# Patient Record
Sex: Female | Born: 1975 | Race: Black or African American | Hispanic: No | Marital: Married | Smoking: Current some day smoker
Health system: Southern US, Community
[De-identification: ages and names within clinical notes are randomized; demographics above are authoritative.]

## PROBLEM LIST (undated history)

## (undated) DIAGNOSIS — D649 Anemia, unspecified: Secondary | ICD-10-CM

## (undated) DIAGNOSIS — E7800 Pure hypercholesterolemia, unspecified: Secondary | ICD-10-CM

## (undated) DIAGNOSIS — I10 Essential (primary) hypertension: Secondary | ICD-10-CM

---

## 2018-07-27 DIAGNOSIS — G5712 Meralgia paresthetica, left lower limb: Secondary | ICD-10-CM | POA: Insufficient documentation

## 2018-07-27 DIAGNOSIS — M5417 Radiculopathy, lumbosacral region: Secondary | ICD-10-CM | POA: Insufficient documentation

## 2020-04-03 DIAGNOSIS — F17210 Nicotine dependence, cigarettes, uncomplicated: Secondary | ICD-10-CM | POA: Insufficient documentation

## 2020-04-03 DIAGNOSIS — G4733 Obstructive sleep apnea (adult) (pediatric): Secondary | ICD-10-CM | POA: Insufficient documentation

## 2020-11-27 ENCOUNTER — Encounter (HOSPITAL_COMMUNITY): Payer: Self-pay

## 2020-11-27 ENCOUNTER — Emergency Department (HOSPITAL_COMMUNITY)
Admission: EM | Admit: 2020-11-27 | Discharge: 2020-11-27 | Disposition: A | Discharge status: Home / Self Care | Payer: Medicaid Other | Attending: Emergency Medicine | Admitting: Emergency Medicine

## 2020-11-27 ENCOUNTER — Other Ambulatory Visit: Payer: Self-pay

## 2020-11-27 DIAGNOSIS — I10 Essential (primary) hypertension: Secondary | ICD-10-CM | POA: Insufficient documentation

## 2020-11-27 DIAGNOSIS — Z20822 Contact with and (suspected) exposure to covid-19: Secondary | ICD-10-CM | POA: Diagnosis not present

## 2020-11-27 DIAGNOSIS — H6692 Otitis media, unspecified, left ear: Secondary | ICD-10-CM | POA: Diagnosis not present

## 2020-11-27 DIAGNOSIS — R058 Other specified cough: Secondary | ICD-10-CM

## 2020-11-27 DIAGNOSIS — H6690 Otitis media, unspecified, unspecified ear: Secondary | ICD-10-CM

## 2020-11-27 DIAGNOSIS — J069 Acute upper respiratory infection, unspecified: Secondary | ICD-10-CM | POA: Insufficient documentation

## 2020-11-27 DIAGNOSIS — R059 Cough, unspecified: Secondary | ICD-10-CM | POA: Diagnosis present

## 2020-11-27 HISTORY — DX: Essential (primary) hypertension: I10

## 2020-11-27 LAB — RESP PANEL BY RT-PCR (FLU A&B, COVID) ARPGX2
Influenza A by PCR: NEGATIVE
Influenza B by PCR: NEGATIVE
SARS Coronavirus 2 by RT PCR: NEGATIVE

## 2020-11-27 MED ORDER — BENZONATATE 100 MG PO CAPS: 100.0000 mg | ORAL_CAPSULE | Freq: Three times a day (TID) | ORAL | 0 refills | Status: DC

## 2020-11-27 MED ORDER — AMOXICILLIN-POT CLAVULANATE 875-125 MG PO TABS: 1.0000 | ORAL_TABLET | Freq: Two times a day (BID) | ORAL | 0 refills | Status: AC

## 2020-11-27 NOTE — Discharge Instructions (Addendum)
Recommend follow-up in 3 days for further imaging and reassessment if symptoms do not resolve

## 2020-11-27 NOTE — ED Provider Notes (Signed)
[facility]-EMERGENCY DEPT Provider Note   CSN: [id number] Arrival date & time: 11/27/20  0805     History Chief Complaint  Patient presents with   Cough   Nasal Congestion    Miranda Charles is a 45 y.o. female.   Cough Cough characteristics:  Productive Sputum characteristics:  Rusty Severity:  Mild Duration:  2 days Timing:  Constant Progression:  Worsening Chronicity:  New Smoker: yes   Context: upper respiratory infection   Relieved by:  Nothing Worsened by:  Nothing Ineffective treatments:  None tried Associated symptoms: chills, ear fullness, ear pain, rhinorrhea, sinus congestion and sore throat   Associated symptoms: no fever, no shortness of breath and no wheezing    45 year old female presenting to the emergency department with a cough, chills and nasal congestion for the past 2 days.  She also endorses left-sided ear pain and a sensation of fullness in her left ear.  She endorses nasal drainage and posterior oropharyngeal drainage.  She states that her cough is mildly productive of rusty sputum she denies any fevers.  She denies any specific shortness of breath or chest pain.  She states that her chest only hurts when she coughs.  She denies a history of a diagnosis of COPD  Past Medical History:  Diagnosis Date   HTN (hypertension)     There are no problems to display for this patient.   History reviewed. No pertinent surgical history.   OB History   No obstetric history on file.     History reviewed. No pertinent family history.  Social History   Tobacco Use   Smoking status: Never   Smokeless tobacco: Never    Home Medications Prior to Admission medications   Medication Sig Start Date End Date Taking? Authorizing Provider  amoxicillin-clavulanate (AUGMENTIN) 875-125 MG tablet Take 1 tablet by mouth every 12 (twelve) hours for 5 days. 11/27/20 12/02/20 Yes Ernie Avena, MD  benzonatate (TESSALON) 100 MG capsule Take 1  capsule (100 mg total) by mouth every 8 (eight) hours. 11/27/20  Yes Ernie Avena, MD    Allergies    Onion  Review of Systems   Review of Systems  Constitutional:  Positive for chills. Negative for fever.  HENT:  Positive for ear pain, rhinorrhea and sore throat.   Respiratory:  Positive for cough. Negative for shortness of breath and wheezing.   All other systems reviewed and are negative.  Physical Exam Updated Vital Signs BP (!) 151/89 (BP Location: Left Arm)   Pulse 82   Temp 97.9 F (36.6 C) (Oral)   Resp 18   LMP 11/25/2020 (Approximate)   SpO2 98%   Physical Exam Vitals and nursing note reviewed.  Constitutional:      General: She is not in acute distress.    Appearance: She is well-developed.  HENT:     Head: Normocephalic and atraumatic.     Right Ear: Tympanic membrane, ear canal and external ear normal.     Left Ear: A middle ear effusion is present. No mastoid tenderness. Tympanic membrane is bulging. Tympanic membrane is not perforated.  Eyes:     Conjunctiva/sclera: Conjunctivae normal.  Cardiovascular:     Rate and Rhythm: Normal rate and regular rhythm.     Heart sounds: No murmur heard. Pulmonary:     Effort: Pulmonary effort is normal. No respiratory distress.     Breath sounds: Normal breath sounds.  Abdominal:     Palpations: Abdomen is soft.  Tenderness: There is no abdominal tenderness.  Musculoskeletal:     Cervical back: Neck supple.  Skin:    General: Skin is warm and dry.  Neurological:     Mental Status: She is alert.    ED Results / Procedures / Treatments   Labs (all labs ordered are listed, but only abnormal results are displayed) Labs Reviewed  RESP PANEL BY RT-PCR (FLU A&B, COVID) [id number]    EKG None  Radiology No results found.  Procedures Procedures   Medications Ordered in ED Medications - No data to display  ED Course  I have reviewed the triage vital signs and the nursing notes.  Pertinent labs & imaging  results that were available during my care of the patient were reviewed by me and considered in my medical decision making (see chart for details).    MDM Rules/Calculators/A&P                           Miranda Charles is a 45 y.o. female who presents to the ED with a 2 day history of ear pain, productive cough, rhinorrhea, and nasal congestion. No fevers.  On my exam, the patient is well-appearing and well-hydrated.  The patient's lungs are clear to auscultation bilaterally. Additionally, the patient has a soft/non-tender abdomen, and no oropharyngeal exudates.  There are no signs of meningismus.  Concerns for otitis media on the left with a bulging tympanic membrane.  No mastoid tenderness to suggest mastoiditis.  Patient is overall well-appearing, lungs are clear to auscultation bilaterally with symptoms consistent with likely URI.  The patient's presentation is most consistent with a viral upper respiratory infection with some concern for otitis media.  Given the patient's respiratory symptoms, productive cough, bulging TM on the left, will treat with a course of Augmentin and have the patient follow-up outpatient.  Lungs are clear to auscultation, I do not think further evaluation with chest x-ray given the short duration of symptoms is warranted at this time.  If patient remains unimproved after a course of outpatient antibiotics, recommended the patient return to the emergency department for repeat evaluation and further imaging.  COVID-19 and influenza PCR testing collected and resulted negative.  I discussed symptomatic management, including hydration, motrin, and tylenol. The patient felt safe being discharged from the ED.  They agreed to followup with the PCP if needed.  I provided ED return precautions.  Final Clinical Impression(s) / ED Diagnoses Final diagnoses:  Productive cough  Acute otitis media, unspecified otitis media type  Upper respiratory tract infection, unspecified type     Rx / DC Orders ED Discharge Orders          Ordered    amoxicillin-clavulanate (AUGMENTIN) 875-125 MG tablet  Every 12 hours        11/27/20 1002    benzonatate (TESSALON) 100 MG capsule  Every 8 hours        11/27/20 1002             Ernie Avena, MD 11/27/20 1004

## 2020-11-27 NOTE — ED Triage Notes (Signed)
Pt arrived via POV, c/o cough, chills and nasal congestion since last night. Denies any known sick contacts.

## 2020-12-24 ENCOUNTER — Other Ambulatory Visit: Payer: Self-pay

## 2020-12-24 ENCOUNTER — Ambulatory Visit
Admission: RE | Admit: 2020-12-24 | Discharge: 2020-12-24 | Disposition: A | Discharge status: Home / Self Care | Payer: Medicaid Other | Source: Ambulatory Visit | Attending: Physician Assistant | Admitting: Physician Assistant

## 2020-12-24 VITALS — BP 128/86 | HR 61 | Temp 97.9°F | Resp 16

## 2020-12-24 DIAGNOSIS — M25511 Pain in right shoulder: Secondary | ICD-10-CM | POA: Diagnosis not present

## 2020-12-24 MED ORDER — PREDNISONE 20 MG PO TABS: 40.0000 mg | ORAL_TABLET | Freq: Every day | ORAL | 0 refills | Status: AC

## 2020-12-24 MED ORDER — CYCLOBENZAPRINE HCL 10 MG PO TABS: 10.0000 mg | ORAL_TABLET | Freq: Two times a day (BID) | ORAL | 0 refills | Status: DC | PRN

## 2020-12-24 NOTE — ED Triage Notes (Signed)
Sharp right shoulder pain worsening with movement or pressure that occasionally "shoots up into my neck." Also states that when she's working it will occasionally give out. Denies numbness/tingling, SOB, chest pain. 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

## 2020-12-24 NOTE — Discharge Instructions (Addendum)
Follow up with ortho if symptoms fail to improve or worsen in any way.  

## 2020-12-24 NOTE — ED Provider Notes (Signed)
[facility]    CSN: [id number] Arrival date & time: 12/24/20  1156      History   Chief Complaint Chief Complaint  Patient presents with   Shoulder Pain    HPI Miranda Charles is a 45 y.o. female.   Patient here today for evaluation of right shoulder pain that is been ongoing for the last couple months.  She states that she will have worsening pain at times, and states that most recently any movement of her shoulder causes pain specifically lifting and moving the 10 pound back she most frequently at work.  She has not had any numbness, but does state that the pain sometimes will radiate into her right side of neck.  She has not any chest pain shortness of breath tingling.     Shoulder Pain Associated symptoms: no fever    Past Medical History:  Diagnosis Date   HTN (hypertension)     There are no problems to display for this patient.   History reviewed. No pertinent surgical history.  OB History   No obstetric history on file.      Home Medications    Prior to Admission medications   Medication Sig Start Date End Date Taking? Authorizing Provider  cyclobenzaprine (FLEXERIL) 10 MG tablet Take 1 tablet (10 mg total) by mouth 2 (two) times daily as needed for muscle spasms. 12/24/20  Yes Tomi Bamberger, PA-C  predniSONE (DELTASONE) 20 MG tablet Take 2 tablets (40 mg total) by mouth daily with breakfast for 5 days. 12/24/20 12/29/20 Yes Tomi Bamberger, PA-C  benzonatate (TESSALON) 100 MG capsule Take 1 capsule (100 mg total) by mouth every 8 (eight) hours. 11/27/20   Ernie Avena, MD    Family History History reviewed. No pertinent family history.  Social History Social History   Tobacco Use   Smoking status: Never   Smokeless tobacco: Never     Allergies   Onion   Review of Systems Review of Systems  Constitutional:  Negative for chills and fever.  Eyes:  Negative for discharge and redness.  Respiratory:  Negative for shortness of  breath.   Cardiovascular:  Negative for chest pain.  Gastrointestinal:  Negative for nausea and vomiting.  Musculoskeletal:  Positive for arthralgias and myalgias.  Neurological:  Negative for numbness.    Physical Exam Triage Vital Signs ED Triage Vitals  Enc Vitals Group     BP 12/24/20 1211 128/86     Pulse Rate 12/24/20 1211 61     Resp 12/24/20 1211 16     Temp 12/24/20 1211 97.9 F (36.6 C)     Temp Source 12/24/20 1211 Oral     SpO2 12/24/20 1211 98 %     Weight --      Height --      Head Circumference --      Peak Flow --      Pain Score 12/24/20 1212 9     Pain Loc --      Pain Edu? --      Excl. in GC? --    No data found.  Updated Vital Signs BP 128/86 (BP Location: Left Arm)   Pulse 61   Temp 97.9 F (36.6 C) (Oral)   Resp 16   LMP 11/25/2020 (Approximate)   SpO2 98%      Physical Exam Vitals and nursing note reviewed.  Constitutional:      General: She is not in acute distress.    Appearance: Normal  appearance. She is not ill-appearing.  HENT:     Head: Normocephalic and atraumatic.  Eyes:     Conjunctiva/sclera: Conjunctivae normal.  Cardiovascular:     Rate and Rhythm: Normal rate.  Pulmonary:     Effort: Pulmonary effort is normal.  Musculoskeletal:     Comments: Decreased range of motion of shoulder in all directions due to pain.  Mild tenderness to palpation to diffuse posterior R shoulder  Skin:    General: Skin is warm and dry.  Neurological:     Mental Status: She is alert.  Psychiatric:        Mood and Affect: Mood normal.        Behavior: Behavior normal.        Thought Content: Thought content normal.     UC Treatments / Results  Labs (all labs ordered are listed, but only abnormal results are displayed) Labs Reviewed - No data to display  EKG   Radiology No results found.  Procedures Procedures (including critical care time)  Medications Ordered in UC Medications - No data to display  Initial Impression /  Assessment and Plan / UC Course  I have reviewed the triage vital signs and the nursing notes.  Pertinent labs & imaging results that were available during my care of the patient were reviewed by me and considered in my medical decision making (see chart for details).  Unknown cause of shoulder pain, but given chronicity recommended further evaluation by Ortho.  Contact information for same.  We will trial steroids and muscle relaxer in the meantime.  Encouraged follow-up with any further concerns.  Final Clinical Impressions(s) / UC Diagnoses   Final diagnoses:  Acute pain of right shoulder     Discharge Instructions      Follow up with ortho if symptoms fail to improve or worsen in any way.      ED Prescriptions     Medication Sig Dispense Auth. Provider   predniSONE (DELTASONE) 20 MG tablet Take 2 tablets (40 mg total) by mouth daily with breakfast for 5 days. 10 tablet Erma Pinto F, PA-C   cyclobenzaprine (FLEXERIL) 10 MG tablet Take 1 tablet (10 mg total) by mouth 2 (two) times daily as needed for muscle spasms. 20 tablet Tomi Bamberger, PA-C      PDMP not reviewed this encounter.   Tomi Bamberger, PA-C 12/24/20 1301

## 2020-12-28 ENCOUNTER — Ambulatory Visit: Payer: Medicaid Other | Admitting: Orthopaedic Surgery

## 2021-01-04 ENCOUNTER — Encounter: Payer: Self-pay | Admitting: Orthopaedic Surgery

## 2021-01-04 ENCOUNTER — Ambulatory Visit: Payer: Self-pay

## 2021-01-04 ENCOUNTER — Ambulatory Visit (INDEPENDENT_AMBULATORY_CARE_PROVIDER_SITE_OTHER): Payer: Medicaid Other | Admitting: Orthopaedic Surgery

## 2021-01-04 DIAGNOSIS — M25511 Pain in right shoulder: Secondary | ICD-10-CM | POA: Diagnosis not present

## 2021-01-04 DIAGNOSIS — G8929 Other chronic pain: Secondary | ICD-10-CM | POA: Diagnosis not present

## 2021-01-04 MED ORDER — LIDOCAINE HCL 2 % IJ SOLN
2.0000 mL | INTRAMUSCULAR | Status: AC | PRN
  Administered 2021-01-04: 2 mL

## 2021-01-04 MED ORDER — BUPIVACAINE HCL 0.25 % IJ SOLN
2.0000 mL | INTRAMUSCULAR | Status: AC | PRN
  Administered 2021-01-04: 2 mL via INTRA_ARTICULAR

## 2021-01-04 MED ORDER — TRAMADOL HCL 50 MG PO TABS: 50.0000 mg | ORAL_TABLET | Freq: Three times a day (TID) | ORAL | 2 refills | Status: DC | PRN

## 2021-01-04 MED ORDER — METHYLPREDNISOLONE ACETATE 40 MG/ML IJ SUSP
40.0000 mg | INTRAMUSCULAR | Status: AC | PRN
  Administered 2021-01-04: 40 mg via INTRA_ARTICULAR

## 2021-01-04 NOTE — Progress Notes (Signed)
Office Visit Note   Patient: Miranda Charles           Date of Birth: Mar 29, 1975           MRN: [id number] Visit Date: 01/04/2021              Requested by: No referring provider defined for this encounter. PCP: Pcp, No   Assessment & Plan: Visit Diagnoses:  1. Chronic right shoulder pain     Plan: Impression is right shoulder subacromial bursitis and rotator cuff tendinitis.  Today, we discussed proceeding with subacromial cortisone injection as well as a Jobe exercise program.  She is in agreement with this plan.  Should her symptoms not improve over the next several weeks she will call us and let us know we will obtain an MRI to assess for structural abnormalities.  Otherwise, call with concerns or questions in the meantime.  Follow-Up Instructions: Return if symptoms worsen or fail to improve.   Orders:  Orders Placed This Encounter  Procedures   Large Joint Inj: R subacromial bursa   XR Shoulder Right   No orders of the defined types were placed in this encounter.     Procedures: Large Joint Inj: R subacromial bursa on 01/04/2021 10:56 AM Indications: pain Details: 22 G needle Medications: 2 mL lidocaine 2 %; 2 mL bupivacaine 0.25 %; 40 mg methylPREDNISolone acetate 40 MG/ML Outcome: tolerated well, no immediate complications Patient was prepped and draped in the usual sterile fashion.      Clinical Data: No additional findings.   Subjective: Chief Complaint  Patient presents with   Right Shoulder - Pain    HPI patient is a very pleasant 45 year old female who comes in today with right shoulder pain for the past 2 months.  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  The pain she has been having is primarily to the proximal deltoid and radiates to the top of her shoulder into the lateral right neck.  Pain is worse with abduction and forward flexion.   She was seen at [facility] urgent care recently where she was started on a steroid taper and Flexeril.  This is mildly helped her symptoms.  She denies any paresthesias to the right upper extremity.  No previous cortisone injection to right shoulder.  Review of Systems as detailed in HPI.  All others reviewed and are negative.   Objective: Vital Signs: There were no vitals taken for this visit.  Physical Exam well-developed well-nourished female in no acute distress.  Alert and oriented x3.  Ortho Exam right shoulder exam shows forward flexion to about 100 degrees.  Abduction to about 90 degrees.  Full external rotation.  Internal rotation to L5.  Markedly positive empty can test with 4 out of 5 strength.  Otherwise, 5 out of 5 strength throughout.  She is neurovascularly intact distally.  Specialty Comments:  No specialty comments available.  Imaging: No results found.   PMFS History: There are no problems to display for this patient.  Past Medical History:  Diagnosis Date   HTN (hypertension)     No family history on file.  History reviewed. No pertinent surgical history. Social History   Occupational History   Not on file  Tobacco Use   Smoking status: Never   Smokeless tobacco: Never  Substance and Sexual Activity   Alcohol use: Not  on file   Drug use: Not on file   Sexual activity: Not on file

## 2021-01-29 ENCOUNTER — Ambulatory Visit: Payer: Medicaid Other | Admitting: Orthopaedic Surgery

## 2021-03-08 ENCOUNTER — Other Ambulatory Visit: Payer: Self-pay

## 2021-03-08 ENCOUNTER — Ambulatory Visit
Admission: EM | Admit: 2021-03-08 | Discharge: 2021-03-08 | Disposition: A | Discharge status: Home / Self Care | Payer: Medicaid Other

## 2021-03-08 ENCOUNTER — Ambulatory Visit (INDEPENDENT_AMBULATORY_CARE_PROVIDER_SITE_OTHER): Payer: Medicaid Other

## 2021-03-08 DIAGNOSIS — M545 Low back pain, unspecified: Secondary | ICD-10-CM

## 2021-03-08 DIAGNOSIS — W19XXXA Unspecified fall, initial encounter: Secondary | ICD-10-CM

## 2021-03-08 DIAGNOSIS — M546 Pain in thoracic spine: Secondary | ICD-10-CM

## 2021-03-08 DIAGNOSIS — M25511 Pain in right shoulder: Secondary | ICD-10-CM

## 2021-03-08 DIAGNOSIS — S3992XA Unspecified injury of lower back, initial encounter: Secondary | ICD-10-CM

## 2021-03-08 HISTORY — DX: Anemia, unspecified: D64.9

## 2021-03-08 HISTORY — DX: Pure hypercholesterolemia, unspecified: E78.00

## 2021-03-08 MED ORDER — PREDNISONE 20 MG PO TABS: 40.0000 mg | ORAL_TABLET | Freq: Every day | ORAL | 0 refills | Status: AC

## 2021-03-08 MED ORDER — CYCLOBENZAPRINE HCL 10 MG PO TABS: 10.0000 mg | ORAL_TABLET | Freq: Two times a day (BID) | ORAL | 0 refills | Status: DC | PRN

## 2021-03-08 NOTE — ED Provider Notes (Signed)
[facility]    CSN: [id number] Arrival date & time: 03/08/21  1138      History   Chief Complaint Chief Complaint  Patient presents with   Fall   Back Pain    HPI Miranda Charles is a 45 y.o. female.   Patient here today for evaluation of back pain that is been ongoing since she fell on Monday of this week.  She states that her foot slipped and when she did she fell flat on her back.  She did not hit her head and had no loss of consciousness.  She states since the fall she has continued to have pain from her thoracic spine to her lumbar spine.  She has not had any numbness or tingling.  She does report some pain to her right shoulder as well that is worsened with movement.  She notes bruising to her posterior right shoulder.  She has been taking Aleve without significant relief.   The history is provided by the patient.  Fall Pertinent negatives include no abdominal pain and no shortness of breath.  Back Pain Associated symptoms: no abdominal pain, no fever and no numbness    Past Medical History:  Diagnosis Date   Anemia    High cholesterol    HTN (hypertension)     There are no problems to display for this patient.   History reviewed. No pertinent surgical history.  OB History   No obstetric history on file.      Home Medications    Prior to Admission medications   Medication Sig Start Date End Date Taking? Authorizing Provider  amLODipine (NORVASC) 5 MG tablet Take 1 tablet by mouth daily. 04/07/18  Yes [provider]  atorvastatin (LIPITOR) 20 MG tablet Take 1 tablet by mouth daily. 07/19/20  Yes [provider]  cetirizine (ZYRTEC) 10 MG tablet Take by mouth. 08/19/19  Yes [provider]  cyclobenzaprine (FLEXERIL) 10 MG tablet Take 1 tablet (10 mg total) by mouth 2 (two) times daily as needed for muscle spasms. 03/08/21  Yes Tomi Bamberger, PA-C  metoprolol tartrate (LOPRESSOR) 25 MG tablet Take by mouth. 11/07/19  Yes  [provider]  predniSONE (DELTASONE) 20 MG tablet Take 2 tablets (40 mg total) by mouth daily with breakfast for 5 days. 03/08/21 03/13/21 Yes Tomi Bamberger, PA-C  benzonatate (TESSALON) 100 MG capsule Take 1 capsule (100 mg total) by mouth every 8 (eight) hours. 11/27/20   Ernie Avena, MD  Cholecalciferol 125 MCG (5000 UT) TABS Take by mouth.    [provider]  ferrous sulfate 325 (65 FE) MG tablet Take by mouth.    [provider]  traMADol (ULTRAM) 50 MG tablet Take 1 tablet (50 mg total) by mouth 3 (three) times daily as needed. 01/04/21   Cristie Hem, PA-C    Family History History reviewed. No pertinent family history.  Social History Social History   Tobacco Use   Smoking status: Some Days    Types: Cigarettes   Smokeless tobacco: Never  Vaping Use   Vaping Use: Never used  Substance Use Topics   Alcohol use: Yes    Comment: social drinker   Drug use: Never     Allergies   Onion   Review of Systems Review of Systems  Constitutional:  Negative for chills and fever.  Eyes:  Negative for discharge and redness.  Respiratory:  Negative for shortness of breath.   Gastrointestinal:  Negative for abdominal pain,  nausea and vomiting.  Genitourinary:  Positive for vaginal bleeding and vaginal discharge.  Musculoskeletal:  Positive for arthralgias, back pain and myalgias.  Neurological:  Negative for numbness.    Physical Exam Triage Vital Signs ED Triage Vitals  Enc Vitals Group     BP 03/08/21 1257 122/83     Pulse Rate 03/08/21 1257 62     Resp 03/08/21 1257 18     Temp 03/08/21 1257 98.5 F (36.9 C)     Temp src --      SpO2 03/08/21 1257 98 %     Weight --      Height --      Head Circumference --      Peak Flow --      Pain Score 03/08/21 1254 8     Pain Loc --      Pain Edu? --      Excl. in GC? --    No data found.  Updated Vital Signs BP 122/83    Pulse 62    Temp 98.5 F (36.9 C)    Resp 18    LMP  02/18/2021 (Approximate)    SpO2 98%      Physical Exam Vitals and nursing note reviewed.  Constitutional:      General: She is not in acute distress.    Appearance: Normal appearance. She is not ill-appearing.  HENT:     Head: Normocephalic and atraumatic.  Eyes:     Conjunctiva/sclera: Conjunctivae normal.  Cardiovascular:     Rate and Rhythm: Normal rate.  Pulmonary:     Effort: Pulmonary effort is normal.  Musculoskeletal:     Comments: Mild TTP diffusely to midline spine from thoracic through lumbar spine, mild TTP to right posterior shoulder. Full ROM of right shoulder, elbow, wrist  Skin:    Comments: Mild bruising to right posterior shoulder  Neurological:     Mental Status: She is alert.  Psychiatric:        Mood and Affect: Mood normal.        Behavior: Behavior normal.        Thought Content: Thought content normal.     UC Treatments / Results  Labs (all labs ordered are listed, but only abnormal results are displayed) Labs Reviewed - No data to display  EKG   Radiology DG Thoracic Spine 2 View  Result Date: 03/08/2021 CLINICAL DATA:  Fall, pain, initial encounter. EXAM: THORACIC SPINE 2 VIEWS COMPARISON:  None. FINDINGS: Alignment is anatomic. Vertebral body and disc space height are maintained. Very mild endplate degenerative changes in the midthoracic spine. Visualized chest is unremarkable. IMPRESSION: No acute findings. Electronically Signed   By: Leanna Battles M.D.   On: 03/08/2021 13:48   DG Lumbar Spine Complete  Result Date: 03/08/2021 CLINICAL DATA:  Fall, low back pain, initial encounter. EXAM: LUMBAR SPINE - COMPLETE 4+ VIEW COMPARISON:  None. FINDINGS: Alignment is anatomic. Vertebral body and disc space height are maintained. No degenerative changes. No definite pars defects. IMPRESSION: No acute findings. Electronically Signed   By: Leanna Battles M.D.   On: 03/08/2021 13:46   DG Shoulder Right  Result Date: 03/08/2021 CLINICAL DATA:   Fall, pain, initial encounter. EXAM: RIGHT SHOULDER - 2+ VIEW COMPARISON:  01/04/2021. FINDINGS: No acute osseous or joint abnormality. Visualized portion of the right chest is unremarkable. IMPRESSION: Negative. Electronically Signed   By: Leanna Battles M.D.   On: 03/08/2021 13:47    Procedures Procedures (including critical care  time)  Medications Ordered in UC Medications - No data to display  Initial Impression / Assessment and Plan / UC Course  I have reviewed the triage vital signs and the nursing notes.  Pertinent labs & imaging results that were available during my care of the patient were reviewed by me and considered in my medical decision making (see chart for details).    Prednisone and Flexeril prescribed for suspected muscular pain.  No fractures noted on x-ray.  Recommended follow-up if symptoms do not improve with treatment or if they worsen in any way.  Final Clinical Impressions(s) / UC Diagnoses   Final diagnoses:  Fall, initial encounter  Injury of back, initial encounter   Discharge Instructions   None    ED Prescriptions     Medication Sig Dispense Auth. Provider   predniSONE (DELTASONE) 20 MG tablet Take 2 tablets (40 mg total) by mouth daily with breakfast for 5 days. 10 tablet Erma Pinto F, PA-C   cyclobenzaprine (FLEXERIL) 10 MG tablet Take 1 tablet (10 mg total) by mouth 2 (two) times daily as needed for muscle spasms. 20 tablet Tomi Bamberger, PA-C      PDMP not reviewed this encounter.   Tomi Bamberger, PA-C 03/08/21 1447

## 2021-03-08 NOTE — ED Triage Notes (Signed)
Patient presents to Urgent Care with complaints of back pain from a fall on Monday. She states she slipped getting out her car. She is not sure if she hit her head. Treating pain with aleve.   Denies LOC.

## 2021-04-03 ENCOUNTER — Ambulatory Visit (INDEPENDENT_AMBULATORY_CARE_PROVIDER_SITE_OTHER): Payer: Medicaid Other | Admitting: Nurse Practitioner

## 2021-04-03 ENCOUNTER — Other Ambulatory Visit: Payer: Self-pay

## 2021-04-03 ENCOUNTER — Encounter: Payer: Self-pay | Admitting: Nurse Practitioner

## 2021-04-03 VITALS — BP 131/85 | HR 89 | Temp 98.1°F | Ht 61.0 in | Wt 258.2 lb

## 2021-04-03 DIAGNOSIS — R300 Dysuria: Secondary | ICD-10-CM

## 2021-04-03 DIAGNOSIS — Z0000 Encounter for general adult medical examination without abnormal findings: Secondary | ICD-10-CM | POA: Diagnosis not present

## 2021-04-03 DIAGNOSIS — Z6841 Body Mass Index (BMI) 40.0 and over, adult: Secondary | ICD-10-CM

## 2021-04-03 DIAGNOSIS — M545 Low back pain, unspecified: Secondary | ICD-10-CM | POA: Diagnosis not present

## 2021-04-03 DIAGNOSIS — F17210 Nicotine dependence, cigarettes, uncomplicated: Secondary | ICD-10-CM

## 2021-04-03 LAB — POCT GLYCOSYLATED HEMOGLOBIN (HGB A1C)
HbA1c POC (<> result, manual entry): 5.4 % (ref 4.0–5.6)
HbA1c, POC (controlled diabetic range): 5.4 % (ref 0.0–7.0)
HbA1c, POC (prediabetic range): 5.4 % — AB (ref 5.7–6.4)
Hemoglobin A1C: 5.4 % (ref 4.0–5.6)

## 2021-04-03 LAB — POCT URINALYSIS DIP (CLINITEK)
Bilirubin, UA: NEGATIVE
Blood, UA: NEGATIVE
Glucose, UA: NEGATIVE mg/dL
Ketones, POC UA: NEGATIVE mg/dL
Leukocytes, UA: NEGATIVE
Nitrite, UA: NEGATIVE
POC PROTEIN,UA: NEGATIVE
Spec Grav, UA: 1.025 (ref 1.010–1.025)
Urobilinogen, UA: 0.2 E.U./dL
pH, UA: 5.5 (ref 5.0–8.0)

## 2021-04-03 LAB — GLUCOSE, POCT (MANUAL RESULT ENTRY): POC Glucose: 97 mg/dl (ref 70–99)

## 2021-04-03 MED ORDER — KETOROLAC TROMETHAMINE 30 MG/ML IJ SOLN
15.0000 mg | Freq: Once | INTRAMUSCULAR | Status: AC
  Administered 2021-04-03: 15 mg via INTRAMUSCULAR

## 2021-04-03 MED ORDER — METHOCARBAMOL 500 MG PO TABS: 500.0000 mg | ORAL_TABLET | Freq: Four times a day (QID) | ORAL | 0 refills | Status: DC | PRN

## 2021-04-03 MED ORDER — LIDOCAINE 4 % EX PTCH: 1.0000 | MEDICATED_PATCH | CUTANEOUS | 0 refills | Status: DC

## 2021-04-03 NOTE — Progress Notes (Signed)
[place], [place]  [address] Phone:  [phone]   Fax:  [phone] Subjective:   Patient ID: Miranda Charles, female    DOB: 09/25/1975, 46 y.o.   MRN: [id number]  Chief Complaint  Patient presents with   Establish Care    Pt is here today to establish care. Pt states that her right side has been bothering her x 2 weeks. Pt states that she has a burning sensation in her left leg x 2 years and is worse at night. Pt states that she has hypertension.    HPI Miranda Charles 45 y.o. female  has a past medical history of Anemia, High cholesterol, and HTN (hypertension). To the [facility] to establish care, last visit with PCP 2 yrs ago, prior to moving to [place] from small town outside of [place]. Patient prior PCP has been refilling medications.  Concerned today about right lower back pain x 2 wks that worsened 2 days ago. Describes pain as sharp. Currently rates pain 9/10, increasing with movement and lying down, denies any improving factors. Has taken OTC medications with no improvement in symptoms. Had some burning with urination last month that resolved with changing body wash. Denies any vaginal irritation or discharge. 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   Also endorses having a burning sensation in LLE x 2 yrs, was informed that she has a pinched nerve. Was treated for it in the past with steroid injections, with no improvement. Requesting referral to new specialist for further evaluation of pinch nerve. Denies any other concerns today. Denies adhering to any diet and/ exercise regimen. Smokes 3-4 x 6 yrs/ PPD.   Denies any fatigue, chest pain, shortness of breath, HA or dizziness. Denies any blurred vision, numbness or tingling.   Past Medical History:  Diagnosis Date   Anemia    High cholesterol    HTN (hypertension)     History reviewed. No pertinent surgical history.  Family History  Family  history unknown: Yes    Social History   Socioeconomic History   Marital status: Single    Spouse name: Not on file   Number of children: Not on file   Years of education: Not on file   Highest education level: Not on file  Occupational History   Not on file  Tobacco Use   Smoking status: Some Days    Packs/day: 1.00    Types: Cigarettes   Smokeless tobacco: Never   Tobacco comments:    1 pack every 3 to 4 days  Vaping Use   Vaping Use: Never used  Substance and Sexual Activity   Alcohol use: Yes    Comment: social drinker   Drug use: Never   Sexual activity: Not on file  Other Topics Concern   Not on file  Social History Narrative   Not on file   Social Determinants of Health   Financial Resource Strain: Not on file  Food Insecurity: Not on file  Transportation Needs: Not on file  Physical Activity: Not on file  Stress: Not on file  Social Connections: Not on file  Intimate Partner Violence: Not on file    Outpatient Medications Prior to Visit  Medication Sig Dispense Refill   amLODipine (NORVASC) 5 MG tablet Take 1 tablet by mouth daily.     ferrous sulfate 325 (65 FE) MG tablet Take by mouth.     metoprolol tartrate (LOPRESSOR) 25 MG  tablet Take by mouth.     spironolactone-hydrochlorothiazide (ALDACTAZIDE) 25-25 MG tablet Take 1 tablet by mouth daily.     cyclobenzaprine (FLEXERIL) 10 MG tablet Take 1 tablet (10 mg total) by mouth 2 (two) times daily as needed for muscle spasms. 20 tablet 0   atorvastatin (LIPITOR) 20 MG tablet Take 1 tablet by mouth daily. (Patient not taking: Reported on 04/03/2021)     benzonatate (TESSALON) 100 MG capsule Take 1 capsule (100 mg total) by mouth every 8 (eight) hours. (Patient not taking: Reported on 04/03/2021) 21 capsule 0   cetirizine (ZYRTEC) 10 MG tablet Take by mouth. (Patient not taking: Reported on 04/03/2021)     Cholecalciferol 125 MCG (5000 UT) TABS Take by mouth. (Patient not taking: Reported on 04/03/2021)      traMADol (ULTRAM) 50 MG tablet Take 1 tablet (50 mg total) by mouth 3 (three) times daily as needed. (Patient not taking: Reported on 04/03/2021) 30 tablet 2   No facility-administered medications prior to visit.    Allergies  Allergen Reactions   Onion Anaphylaxis and Swelling    "throat closes"     Review of Systems  Constitutional:  Negative for chills, fever and malaise/fatigue.  HENT: Negative.    Eyes: Negative.   Respiratory:  Negative for cough and shortness of breath.   Cardiovascular:  Negative for chest pain, palpitations and leg swelling.  Gastrointestinal:  Negative for abdominal pain, blood in stool, constipation, diarrhea, nausea and vomiting.  Genitourinary:  Positive for dysuria. Negative for flank pain, frequency, hematuria and urgency.  Musculoskeletal:  Positive for back pain.       See HPI  Skin: Negative.   Neurological: Negative.   Psychiatric/Behavioral:  Negative for depression. The patient is not nervous/anxious.   All other systems reviewed and are negative.     Objective:    Physical Exam Vitals reviewed.  Constitutional:      General: She is not in acute distress.    Appearance: Normal appearance. She is obese.  HENT:     Head: Normocephalic.     Right Ear: Tympanic membrane, ear canal and external ear normal. There is no impacted cerumen.     Left Ear: Tympanic membrane, ear canal and external ear normal. There is no impacted cerumen.     Nose: Nose normal. No congestion or rhinorrhea.     Mouth/Throat:     Mouth: Mucous membranes are moist.     Pharynx: Oropharynx is clear. No oropharyngeal exudate or posterior oropharyngeal erythema.  Eyes:     Extraocular Movements: Extraocular movements intact.     Conjunctiva/sclera: Conjunctivae normal.     Pupils: Pupils are equal, round, and reactive to light.  Neck:     Vascular: No carotid bruit.  Cardiovascular:     Rate and Rhythm: Normal rate and regular rhythm.     Pulses: Normal pulses.      Heart sounds: Normal heart sounds.     Comments: No obvious peripheral edema Pulmonary:     Effort: Pulmonary effort is normal.     Breath sounds: Normal breath sounds.  Abdominal:     General: Abdomen is flat. Bowel sounds are normal. There is no distension.     Palpations: Abdomen is soft. There is no mass.     Tenderness: There is no abdominal tenderness. There is right CVA tenderness. There is no left CVA tenderness, guarding or rebound.     Hernia: No hernia is present.  Musculoskeletal:  General: No swelling, tenderness, deformity or signs of injury. Normal range of motion.     Cervical back: Normal range of motion and neck supple. No rigidity or tenderness.     Right lower leg: No edema.     Left lower leg: No edema.  Lymphadenopathy:     Cervical: No cervical adenopathy.  Skin:    General: Skin is warm and dry.     Capillary Refill: Capillary refill takes less than 2 seconds.  Neurological:     General: No focal deficit present.     Mental Status: She is alert and oriented to person, place, and time.  Psychiatric:        Mood and Affect: Mood normal.        Behavior: Behavior normal.        Thought Content: Thought content normal.        Judgment: Judgment normal.    BP 131/85    Pulse 89    Temp 98.1 F (36.7 C)    Ht _0  (1.549 m)    Wt 258 lb 3.2 oz (117.1 kg)    LMP 03/15/2021 (Approximate)    SpO2 100%    BMI 48.79 kg/m  Wt Readings from Last 3 Encounters:  04/03/21 258 lb 3.2 oz (117.1 kg)    Immunization History  Administered Date(s) Administered   Influenza,inj,Quad PF,6+ Mos 04/03/2020   Unspecified SARS-COV-2 Vaccination 08/31/2019    Diabetic Foot Exam - Simple   No data filed     No results found for: TSH No results found for: WBC, HGB, HCT, MCV, PLT No results found for: NA, K, CHLORIDE, CO2, GLUCOSE, BUN, CREATININE, BILITOT, ALKPHOS, AST, ALT, PROT, ALBUMIN, CALCIUM, ANIONGAP, EGFR, GFR No results found for: CHOL No results found  for: HDL No results found for: LDLCALC No results found for: TRIG No results found for: CHOLHDL Lab Results  Component Value Date   HGBA1C 5.4 04/03/2021   HGBA1C 5.4 04/03/2021   HGBA1C 5.4 (A) 04/03/2021   HGBA1C 5.4 04/03/2021       Assessment & Plan:   Problem List Items Addressed This Visit       Other   Class 3 severe obesity due to excess calories with serious comorbidity and body mass index (BMI) of 50.0 to 59.9 in adult [facility])   Relevant Orders   CBC with Differential/Platelet   Comprehensive metabolic panel   Lipid panel Discussed diet and exercise options at length   Nicotine dependence, cigarettes, uncomplicated Discussed cessation goals   Other Visit Diagnoses     Health care maintenance    -  Primary   Relevant Orders   HgB A1c (Completed)   Glucose (CBG) (Completed)   POCT URINALYSIS DIP (CLINITEK) (Completed)   CBC with Differential/Platelet   Comprehensive metabolic panel   Lipid panel Encouraged continued diet and exercise efforts  Encouraged continued compliance with medication  Medication refill not needed at this time, will reevaluate at follow up    Acute right-sided low back pain, unspecified whether sciatica present       Relevant Medications   ketorolac (TORADOL) 30 MG/ML injection 15 mg (Completed)   methocarbamol (ROBAXIN) 500 MG tablet   Lidocaine (HM LIDOCAINE PATCH) 4 % PTCH   Dysuria       Relevant Orders   POCT URINALYSIS DIP (CLINITEK) (Completed)   Follow up in 1 mth for pap smear and reevaluation of medications, sooner as needed    I have discontinued Miranda Charles's cyclobenzaprine.  I am also having her start on methocarbamol and Lidocaine. Additionally, I am having her maintain her benzonatate, traMADol, amLODipine, atorvastatin, cetirizine, Cholecalciferol, ferrous sulfate, metoprolol tartrate, and spironolactone-hydrochlorothiazide. We administered ketorolac.  Meds ordered this encounter  Medications   ketorolac (TORADOL)  30 MG/ML injection 15 mg   methocarbamol (ROBAXIN) 500 MG tablet    Sig: Take 1 tablet (500 mg total) by mouth every 6 (six) hours as needed for muscle spasms (low back pain).    Dispense:  20 tablet    Refill:  0   Lidocaine (HM LIDOCAINE PATCH) 4 % PTCH    Sig: Apply 1 patch topically daily.    Dispense:  7 patch    Refill:  0     Teena Dunk, NP

## 2021-04-03 NOTE — Patient Instructions (Signed)
You were seen today in the [facility] to establish care and for back pain. Labs were collected, results will be available via MyChart or, if abnormal, you will be contacted by clinic staff. You were prescribed medications, please take as directed. Please follow up in 1 mth for Pap smear.

## 2021-04-04 LAB — CBC WITH DIFFERENTIAL/PLATELET
Basophils Absolute: 0 10*3/uL (ref 0.0–0.2)
Basos: 0 %
EOS (ABSOLUTE): 0.1 10*3/uL (ref 0.0–0.4)
Eos: 1 %
Hematocrit: 43.1 % (ref 34.0–46.6)
Hemoglobin: 15 g/dL (ref 11.1–15.9)
Immature Grans (Abs): 0 10*3/uL (ref 0.0–0.1)
Immature Granulocytes: 0 %
Lymphocytes Absolute: 2.1 10*3/uL (ref 0.7–3.1)
Lymphs: 30 %
MCH: 32 pg (ref 26.6–33.0)
MCHC: 34.8 g/dL (ref 31.5–35.7)
MCV: 92 fL (ref 79–97)
Monocytes Absolute: 0.5 10*3/uL (ref 0.1–0.9)
Monocytes: 7 %
Neutrophils Absolute: 4.3 10*3/uL (ref 1.4–7.0)
Neutrophils: 62 %
Platelets: 264 10*3/uL (ref 150–450)
RBC: 4.69 x10E6/uL (ref 3.77–5.28)
RDW: 14.1 % (ref 11.7–15.4)
WBC: 7.1 10*3/uL (ref 3.4–10.8)

## 2021-04-04 LAB — COMPREHENSIVE METABOLIC PANEL
ALT: 7 IU/L (ref 0–32)
AST: 15 IU/L (ref 0–40)
Albumin/Globulin Ratio: 1.4 (ref 1.2–2.2)
Albumin: 4.4 g/dL (ref 3.8–4.8)
Alkaline Phosphatase: 95 IU/L (ref 44–121)
BUN/Creatinine Ratio: 13 (ref 9–23)
BUN: 13 mg/dL (ref 6–24)
Bilirubin Total: 0.3 mg/dL (ref 0.0–1.2)
CO2: 24 mmol/L (ref 20–29)
Calcium: 10 mg/dL (ref 8.7–10.2)
Chloride: 102 mmol/L (ref 96–106)
Creatinine, Ser: 1.03 mg/dL — ABNORMAL HIGH (ref 0.57–1.00)
Globulin, Total: 3.2 g/dL (ref 1.5–4.5)
Glucose: 84 mg/dL (ref 70–99)
Potassium: 4.5 mmol/L (ref 3.5–5.2)
Sodium: 144 mmol/L (ref 134–144)
Total Protein: 7.6 g/dL (ref 6.0–8.5)
eGFR: 69 mL/min/{1.73_m2} (ref >59)

## 2021-04-04 LAB — LIPID PANEL
Chol/HDL Ratio: 4.7 ratio — ABNORMAL HIGH (ref 0.0–4.4)
Cholesterol, Total: 284 mg/dL — ABNORMAL HIGH (ref 100–199)
HDL: 60 mg/dL (ref >39)
LDL Chol Calc (NIH): 198 mg/dL — ABNORMAL HIGH (ref 0–99)
Triglycerides: 141 mg/dL (ref 0–149)
VLDL Cholesterol Cal: 26 mg/dL (ref 5–40)

## 2021-04-09 ENCOUNTER — Telehealth: Payer: Self-pay

## 2021-04-09 NOTE — Telephone Encounter (Signed)
Patient has viewed recent lab results on mychart

## 2021-04-09 NOTE — Telephone Encounter (Signed)
-----   Message from Kathrynn Speed, NP sent at 04/09/2021  2:44 PM EST ----- Labs significant for elevated cholesterol, verbalized not taking medications during visit. Reiterated the importance of taking all medication as prescribed. Will continue to encourage changes in diet and exercise, increased fruits and vegetables, exercise 30 minutes/ day x 5 days. Will continue to monitor. All other labs wnl.

## 2021-05-03 ENCOUNTER — Other Ambulatory Visit: Payer: Self-pay

## 2021-05-03 ENCOUNTER — Encounter: Payer: Self-pay | Admitting: Nurse Practitioner

## 2021-05-03 ENCOUNTER — Ambulatory Visit: Payer: Medicaid Other | Admitting: Nurse Practitioner

## 2021-05-03 VITALS — BP 148/98 | HR 76 | Temp 97.4°F | Ht 61.0 in | Wt 276.0 lb

## 2021-05-03 DIAGNOSIS — I10 Essential (primary) hypertension: Secondary | ICD-10-CM

## 2021-05-03 DIAGNOSIS — E7849 Other hyperlipidemia: Secondary | ICD-10-CM | POA: Diagnosis not present

## 2021-05-03 DIAGNOSIS — M5417 Radiculopathy, lumbosacral region: Secondary | ICD-10-CM

## 2021-05-03 MED ORDER — TRAMADOL HCL 50 MG PO TABS: 50.0000 mg | ORAL_TABLET | Freq: Three times a day (TID) | ORAL | 0 refills | Status: AC | PRN

## 2021-05-03 MED ORDER — SPIRONOLACTONE-HCTZ 25-25 MG PO TABS: 1.0000 | ORAL_TABLET | Freq: Every day | ORAL | 5 refills | Status: DC

## 2021-05-03 MED ORDER — ATORVASTATIN CALCIUM 20 MG PO TABS: 40.0000 mg | ORAL_TABLET | Freq: Every day | ORAL | 2 refills | Status: DC

## 2021-05-03 NOTE — Patient Instructions (Signed)
You were seen today in the [facility] for reevaluation of chronic illness and back pain.You were prescribed medications, please take as directed. Please follow up in 6 mths for reevaluation.

## 2021-05-03 NOTE — Progress Notes (Signed)
[place], [place]  [address] Phone:  [phone]   Fax:  [phone] Subjective:   Patient ID: Miranda Charles, female    DOB: 02-Feb-1976, 46 y.o.   MRN: [id number]  Chief Complaint  Patient presents with   Follow-up    Pt is here today for her follow up visit. Pt will reschedule her Pap smear visit. Pt states that her lower back has been bothering her when she is standing for a long period of time.   HPI Miranda Charles 46 y.o. female  has a past medical history of Anemia, High cholesterol, and HTN (hypertension).  To the [facility] for follow up of chronic illness and back pain.  Patient states that back pain didn't improve with prescribed medications. Took previously prescribed tramadol for pain with moderate improvement, in addition to Aleve. States that pain typically occurs in mid upper back with prolonged standing and specific movements/ ADLs during the day. Currently rates pain 7-8/10 and describes as pressure.   When questioned about elevated B/P, states that she has not taken medications this morning. Has not been monitoring meals or exercising. Decreased cigarette usage to 5 per day. 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 Has been interviewing for jobs, and hopes to be employed soon. Denies any other complaints today.  Denies any fever. Denies any fatigue, chest pain, shortness of breath, HA or dizziness. Denies any blurred vision, numbness or tingling.  Past Medical History:  Diagnosis Date   Anemia    High cholesterol    HTN (hypertension)     History reviewed. No pertinent surgical history.  Family History  Family history unknown: Yes    Social History   Socioeconomic History   Marital status: Single    Spouse name: Not on file   Number of children: Not on file   Years of education: Not on file   Highest education level: Not on file  Occupational History   Not on file  Tobacco Use   Smoking status:  Some Days    Packs/day: 1.00    Types: Cigarettes   Smokeless tobacco: Never   Tobacco comments:    1 pack every 3 to 4 days  Vaping Use   Vaping Use: Never used  Substance and Sexual Activity   Alcohol use: Yes    Comment: social drinker   Drug use: Never   Sexual activity: Yes  Other Topics Concern   Not on file  Social History Narrative   Not on file   Social Determinants of Health   Financial Resource Strain: Not on file  Food Insecurity: Not on file  Transportation Needs: Not on file  Physical Activity: Not on file  Stress: Not on file  Social Connections: Not on file  Intimate Partner Violence: Not on file    Outpatient Medications Prior to Visit  Medication Sig Dispense Refill   Cholecalciferol 125 MCG (5000 UT) TABS Take by mouth.     ferrous sulfate 325 (65 FE) MG tablet Take by mouth.     metoprolol tartrate (LOPRESSOR) 25 MG tablet Take by mouth.     atorvastatin (LIPITOR) 20 MG tablet Take 1 tablet by mouth daily.     spironolactone-hydrochlorothiazide (ALDACTAZIDE) 25-25 MG tablet Take 1 tablet by mouth daily.     traMADol (ULTRAM) 50 MG tablet Take 1 tablet (50 mg total) by mouth 3 (three) times daily as needed. 30 tablet 2   cetirizine (ZYRTEC) 10 MG tablet Take  by mouth. (Patient not taking: Reported on 04/03/2021)     Lidocaine (HM LIDOCAINE PATCH) 4 % PTCH Apply 1 patch topically daily. (Patient not taking: Reported on 05/03/2021) 7 patch 0   methocarbamol (ROBAXIN) 500 MG tablet Take 1 tablet (500 mg total) by mouth every 6 (six) hours as needed for muscle spasms (low back pain). (Patient not taking: Reported on 05/03/2021) 20 tablet 0   amLODipine (NORVASC) 5 MG tablet Take 1 tablet by mouth daily.     benzonatate (TESSALON) 100 MG capsule Take 1 capsule (100 mg total) by mouth every 8 (eight) hours. (Patient not taking: Reported on 04/03/2021) 21 capsule 0   No facility-administered medications prior to visit.    Allergies  Allergen Reactions   Onion  Anaphylaxis and Swelling    "throat closes"     Review of Systems  Constitutional:  Negative for chills, fever and malaise/fatigue.  Respiratory:  Negative for cough and shortness of breath.   Cardiovascular:  Negative for chest pain, palpitations and leg swelling.  Gastrointestinal:  Negative for abdominal pain, blood in stool, constipation, diarrhea, nausea and vomiting.  Musculoskeletal:  Positive for back pain. Negative for falls, joint pain, myalgias and neck pain.  Skin: Negative.   Neurological: Negative.   Psychiatric/Behavioral:  Negative for depression. The patient is not nervous/anxious.   All other systems reviewed and are negative.     Objective:    Physical Exam Vitals reviewed.  Constitutional:      General: She is not in acute distress.    Appearance: Normal appearance. She is obese.  HENT:     Head: Normocephalic.  Cardiovascular:     Rate and Rhythm: Normal rate and regular rhythm.     Pulses: Normal pulses.     Heart sounds: Normal heart sounds.     Comments: No obvious peripheral edema Pulmonary:     Effort: Pulmonary effort is normal.     Breath sounds: Normal breath sounds.  Musculoskeletal:        General: Tenderness present. No swelling, deformity or signs of injury. Normal range of motion.     Right lower leg: No edema.     Left lower leg: No edema.     Comments: Mild to moderate tenderness with palpation of mid thoracic spine and paraspinous area   Skin:    General: Skin is warm and dry.     Capillary Refill: Capillary refill takes less than 2 seconds.  Neurological:     General: No focal deficit present.     Mental Status: She is alert and oriented to person, place, and time.  Psychiatric:        Mood and Affect: Mood normal.        Behavior: Behavior normal.        Thought Content: Thought content normal.        Judgment: Judgment normal.    BP (!) 148/98    Pulse 76    Temp (!) 97.4 F (36.3 C)    Ht _0  (1.549 m)    Wt 276 lb (125.2  kg)    SpO2 98%    BMI 52.15 kg/m  Wt Readings from Last 3 Encounters:  05/03/21 276 lb (125.2 kg)  04/03/21 258 lb 3.2 oz (117.1 kg)    Immunization History  Administered Date(s) Administered   Influenza,inj,Quad PF,6+ Mos 04/03/2020   Unspecified SARS-COV-2 Vaccination 08/31/2019    Diabetic Foot Exam - Simple   No data filed  No results found for: TSH Lab Results  Component Value Date   WBC 7.1 04/03/2021   HGB 15.0 04/03/2021   HCT 43.1 04/03/2021   MCV 92 04/03/2021   PLT 264 04/03/2021   Lab Results  Component Value Date   NA 144 04/03/2021   K 4.5 04/03/2021   CO2 24 04/03/2021   GLUCOSE 84 04/03/2021   BUN 13 04/03/2021   CREATININE 1.03 (H) 04/03/2021   BILITOT 0.3 04/03/2021   ALKPHOS 95 04/03/2021   AST 15 04/03/2021   ALT 7 04/03/2021   PROT 7.6 04/03/2021   ALBUMIN 4.4 04/03/2021   CALCIUM 10.0 04/03/2021   EGFR 69 04/03/2021   Lab Results  Component Value Date   CHOL 284 (H) 04/03/2021   Lab Results  Component Value Date   HDL 60 04/03/2021   Lab Results  Component Value Date   LDLCALC 198 (H) 04/03/2021   Lab Results  Component Value Date   TRIG 141 04/03/2021   Lab Results  Component Value Date   CHOLHDL 4.7 (H) 04/03/2021   Lab Results  Component Value Date   HGBA1C 5.4 04/03/2021   HGBA1C 5.4 04/03/2021   HGBA1C 5.4 (A) 04/03/2021   HGBA1C 5.4 04/03/2021       Assessment & Plan:   Problem List Items Addressed This Visit       Nervous and Auditory   Lumbosacral radiculopathy at S1 - Primary   Relevant Medications   traMADol (ULTRAM) 50 MG tablet   Other Relevant Orders   AMB referral to orthopedics Discussed non pharmacological methods for management of symptoms   Other Visit Diagnoses     Primary hypertension       Relevant Medications   spironolactone-hydrochlorothiazide (ALDACTAZIDE) 25-25 MG tablet   atorvastatin (LIPITOR) 20 MG tablet Encouraged continued diet and exercise efforts  Encouraged  continued compliance with medication     Other hyperlipidemia       Relevant Medications   spironolactone-hydrochlorothiazide (ALDACTAZIDE) 25-25 MG tablet   atorvastatin (LIPITOR) 20 MG tablet Encouraged continued diet and exercise efforts  Encouraged continued compliance with medication     Follow up in 6 mths for reevaluation of chronic illness and back pain, sooner as needed     I have discontinued Rinnah Demartini's benzonatate and amLODipine. I have also changed her traMADol and atorvastatin. Additionally, I am having her maintain her cetirizine, Cholecalciferol, ferrous sulfate, metoprolol tartrate, methocarbamol, Lidocaine, and spironolactone-hydrochlorothiazide.  Meds ordered this encounter  Medications   traMADol (ULTRAM) 50 MG tablet    Sig: Take 1 tablet (50 mg total) by mouth 3 (three) times daily as needed for up to 14 days.    Dispense:  42 tablet    Refill:  0   spironolactone-hydrochlorothiazide (ALDACTAZIDE) 25-25 MG tablet    Sig: Take 1 tablet by mouth daily.    Dispense:  30 tablet    Refill:  5   atorvastatin (LIPITOR) 20 MG tablet    Sig: Take 2 tablets (40 mg total) by mouth daily.    Dispense:  60 tablet    Refill:  2     Teena Dunk, NP

## 2021-05-14 ENCOUNTER — Ambulatory Visit (INDEPENDENT_AMBULATORY_CARE_PROVIDER_SITE_OTHER): Payer: Medicaid Other | Admitting: Orthopaedic Surgery

## 2021-05-14 ENCOUNTER — Other Ambulatory Visit: Payer: Self-pay

## 2021-05-14 ENCOUNTER — Encounter: Payer: Self-pay | Admitting: Orthopaedic Surgery

## 2021-05-14 DIAGNOSIS — M5442 Lumbago with sciatica, left side: Secondary | ICD-10-CM

## 2021-05-14 DIAGNOSIS — G8929 Other chronic pain: Secondary | ICD-10-CM | POA: Diagnosis not present

## 2021-05-14 MED ORDER — PREDNISONE 10 MG (21) PO TBPK: ORAL_TABLET | ORAL | 0 refills | Status: DC

## 2021-05-14 MED ORDER — METHOCARBAMOL 500 MG PO TABS: 500.0000 mg | ORAL_TABLET | Freq: Two times a day (BID) | ORAL | 0 refills | Status: DC | PRN

## 2021-05-14 NOTE — Progress Notes (Signed)
Office Visit Note   Patient: Miranda Charles           Date of Birth: [birth year]           MRN: [id number] Visit Date: 05/14/2021              Requested by: Orion Crook I, NP [address], [place],  [place] [address] PCP: Orion Crook I, NP   Assessment & Plan: Visit Diagnoses:  1. Chronic left-sided low back pain with left-sided sciatica     Plan: Impression is chronic left-sided low back pain and left lower extremity radiculopathy.  At this point, would like to start the patient on a steroid taper muscle relaxer.  I would also like to start her in formal physical therapy but will go ahead and order an MRI to assess for disc herniation.  She will follow-up with [place] once this is been completed.  Call with concerns or questions.  Follow-Up Instructions: Return for f/u after MRI.   Orders:  Orders Placed This Encounter  Procedures   Ambulatory referral to Physical Therapy   Meds ordered this encounter  Medications   predniSONE (STERAPRED UNI-PAK 21 TAB) 10 MG (21) TBPK tablet    Sig: Take as directed    Dispense:  21 tablet    Refill:  0   methocarbamol (ROBAXIN) 500 MG tablet    Sig: Take 1 tablet (500 mg total) by mouth 2 (two) times daily as needed.    Dispense:  20 tablet    Refill:  0      Procedures: No procedures performed   Clinical Data: No additional findings.   Subjective: Chief Complaint  Patient presents with   Lower Back - Pain    HPI patient is a pleasant 46 year old female who comes in today with chronic left lower back pain and left lower extremity radiculopathy.  She has been dealing with this for many years.  Pain appears to be worse when she is cleaning specifically when she is sweeping floors.  She also has increased pain with any increased walking type activities.  She does often get relief with leaning forward.  She has been taking tramadol without significant relief.  She does note burning to the left lower extremity.  She was seen  somewhere out of town where it sounds like she underwent ESI which helped for about 1 to 2 days.  She does not think she is actually had the MRI.  She has not been to formal physical therapy but has been working on home exercise program.  Review of Systems as detailed in HPI.  All others reviewed and are negative.   Objective: Vital Signs: There were no vitals taken for this visit.  Physical Exam well-developed well-nourished female no acute distress.  Alert and oriented x3.  Ortho Exam lumbar spine exam shows moderate spinous tenderness around the upper lumbar levels.  She does not have any pain with lumbar flexion, extension or rotation.  Positive straight leg raise on the left.  No focal weakness.  She is neurovascular intact distally.  Specialty Comments:  No specialty comments available.  Imaging: No new imaging   PMFS History: Patient Active Problem List   Diagnosis Date Noted   Class 3 severe obesity due to excess calories with serious comorbidity and body mass index (BMI) of 50.0 to 59.9 in adult (HCC) 04/03/2020   Nicotine dependence, cigarettes, uncomplicated 04/03/2020   Obstructive sleep apnea 04/03/2020   Lumbosacral radiculopathy at S1 07/27/2018  Meralgia paresthetica of left side 07/27/2018   Past Medical History:  Diagnosis Date   Anemia    High cholesterol    HTN (hypertension)     Family History  Family history unknown: Yes    History reviewed. No pertinent surgical history. Social History   Occupational History   Not on file  Tobacco Use   Smoking status: Some Days    Packs/day: 1.00    Types: Cigarettes   Smokeless tobacco: Never   Tobacco comments:    1 pack every 3 to 4 days  Vaping Use   Vaping Use: Never used  Substance and Sexual Activity   Alcohol use: Yes    Comment: social drinker   Drug use: Never   Sexual activity: Yes

## 2021-06-01 ENCOUNTER — Other Ambulatory Visit: Payer: Medicaid Other

## 2021-06-13 ENCOUNTER — Other Ambulatory Visit: Payer: Medicaid Other

## 2021-08-07 ENCOUNTER — Other Ambulatory Visit: Payer: Self-pay

## 2021-08-07 DIAGNOSIS — E7849 Other hyperlipidemia: Secondary | ICD-10-CM

## 2021-08-07 MED ORDER — ATORVASTATIN CALCIUM 20 MG PO TABS: 40.0000 mg | ORAL_TABLET | Freq: Every day | ORAL | 2 refills | Status: DC

## 2021-11-01 ENCOUNTER — Ambulatory Visit: Payer: Medicaid Other | Admitting: Nurse Practitioner

## 2021-11-07 ENCOUNTER — Ambulatory Visit: Payer: Medicaid Other | Admitting: Nurse Practitioner

## 2021-11-07 ENCOUNTER — Encounter: Payer: Self-pay | Admitting: Nurse Practitioner

## 2021-11-07 VITALS — BP 119/90 | HR 75 | Temp 97.5°F | Ht 61.0 in | Wt 264.8 lb

## 2021-11-07 DIAGNOSIS — M545 Low back pain, unspecified: Secondary | ICD-10-CM | POA: Diagnosis not present

## 2021-11-07 DIAGNOSIS — M5417 Radiculopathy, lumbosacral region: Secondary | ICD-10-CM

## 2021-11-07 DIAGNOSIS — I10 Essential (primary) hypertension: Secondary | ICD-10-CM | POA: Insufficient documentation

## 2021-11-07 MED ORDER — PREDNISONE 20 MG PO TABS: 20.0000 mg | ORAL_TABLET | Freq: Every day | ORAL | 0 refills | Status: AC

## 2021-11-07 MED ORDER — METOPROLOL TARTRATE 25 MG PO TABS: 25.0000 mg | ORAL_TABLET | Freq: Every day | ORAL | 0 refills | Status: DC

## 2021-11-07 MED ORDER — CETIRIZINE HCL 10 MG PO TABS: 10.0000 mg | ORAL_TABLET | Freq: Every day | ORAL | 0 refills | Status: DC

## 2021-11-07 MED ORDER — GABAPENTIN 100 MG PO CAPS: 100.0000 mg | ORAL_CAPSULE | Freq: Three times a day (TID) | ORAL | 3 refills | Status: DC

## 2021-11-07 MED ORDER — SPIRONOLACTONE-HCTZ 25-25 MG PO TABS: 1.0000 | ORAL_TABLET | Freq: Every day | ORAL | 5 refills | Status: DC

## 2021-11-07 NOTE — Assessment & Plan Note (Signed)
-   CBC - Comprehensive metabolic panel - metoprolol tartrate (LOPRESSOR) 25 MG tablet; Take 1 tablet (25 mg total) by mouth daily.  Dispense: 90 tablet; Refill: 0 - spironolactone-hydrochlorothiazide (ALDACTAZIDE) 25-25 MG tablet; Take 1 tablet by mouth daily.  Dispense: 30 tablet; Refill: 5   2. Acute right-sided low back pain, unspecified whether sciatica present  - predniSONE (DELTASONE) 20 MG tablet; Take 1 tablet (20 mg total) by mouth daily with breakfast for 5 days.  Dispense: 5 tablet; Refill: 0 - gabapentin (NEURONTIN) 100 MG capsule; Take 1 capsule (100 mg total) by mouth 3 (three) times daily.  Dispense: 90 capsule; Refill: 3    Follow up:  Follow up in 6 months or sooner

## 2021-11-07 NOTE — Progress Notes (Signed)
@Patient  ID: , female    DOB: [birth year], 46 y.o.   MRN: [id number]  Chief Complaint  Patient presents with   Back Pain    Pt states she has chronic back pain also RT hand and wrist pain swelling  for the past  5 month's. Pt is requesting a refill on traMADol,cetirizine, atorvastatin, spironolactone-hydrochlorothiazide ,spironolactone-hydrochlorothiazide     Referring provider: [id number], NP  HPI  Miranda Charles 46 y.o. female  has a past medical history of Anemia, High cholesterol, and HTN (hypertension).  To the [facility] for follow up of chronic illness and back pain.  Patient presents for follow up on back pain. States that she has pain to her left back that radiates down her left side. This has been an issues for several years. Patient was referred to ortho, but did not go for follow up appointment. She states that she uses repetitive hand motions at work and right wrist has been hurting. Denies f/c/s, n/v/d, hemoptysis, PND, leg swelling Denies chest pain or edema     Allergies  Allergen Reactions   Onion Anaphylaxis and Swelling    "throat closes"     Immunization History  Administered Date(s) Administered   Influenza,inj,Quad PF,6+ Mos 04/03/2020   Unspecified SARS-COV-2 Vaccination 08/31/2019    Past Medical History:  Diagnosis Date   Anemia    High cholesterol    HTN (hypertension)     Tobacco History: Social History   Tobacco Use  Smoking Status Some Days   Packs/day: 1.00   Types: Cigarettes  Smokeless Tobacco Never  Tobacco Comments   1 pack every 3 to 4 days   Ready to quit: Not Answered Counseling given: Not Answered Tobacco comments: 1 pack every 3 to 4 days   Outpatient Encounter Medications as of 11/07/2021  Medication Sig   Cholecalciferol 125 MCG (5000 UT) TABS Take by mouth.   ferrous sulfate 325 (65 FE) MG tablet Take by mouth.   gabapentin (NEURONTIN) 100 MG capsule Take 1 capsule (100 mg total) by mouth 3 (three) times  daily.   predniSONE (DELTASONE) 20 MG tablet Take 1 tablet (20 mg total) by mouth daily with breakfast for 5 days.   traMADol (ULTRAM) 50 MG tablet Take 50 mg by mouth 3 (three) times daily as needed.   [DISCONTINUED] cetirizine (ZYRTEC) 10 MG tablet Take by mouth.   [DISCONTINUED] metoprolol tartrate (LOPRESSOR) 25 MG tablet Take by mouth.   atorvastatin (LIPITOR) 20 MG tablet Take 2 tablets (40 mg total) by mouth daily.   cetirizine (ZYRTEC) 10 MG tablet Take 1 tablet (10 mg total) by mouth daily.   Lidocaine (HM LIDOCAINE PATCH) 4 % PTCH Apply 1 patch topically daily. (Patient not taking: Reported on 05/03/2021)   methocarbamol (ROBAXIN) 500 MG tablet Take 1 tablet (500 mg total) by mouth 2 (two) times daily as needed. (Patient not taking: Reported on 11/07/2021)   metoprolol tartrate (LOPRESSOR) 25 MG tablet Take 1 tablet (25 mg total) by mouth daily.   predniSONE (STERAPRED UNI-PAK 21 TAB) 10 MG (21) TBPK tablet Take as directed (Patient not taking: Reported on 11/07/2021)   spironolactone-hydrochlorothiazide (ALDACTAZIDE) 25-25 MG tablet Take 1 tablet by mouth daily.   [DISCONTINUED] spironolactone-hydrochlorothiazide (ALDACTAZIDE) 25-25 MG tablet Take 1 tablet by mouth daily.   No facility-administered encounter medications on file as of 11/07/2021.     Review of Systems  Review of Systems  Constitutional: Negative.   HENT: Negative.    Cardiovascular: Negative.   Gastrointestinal: Negative.  Musculoskeletal:  Positive for arthralgias, back pain and myalgias.  Allergic/Immunologic: Negative.   Neurological: Negative.   Psychiatric/Behavioral: Negative.         Physical Exam  BP (!) 119/90 (BP Location: Right Arm, Patient Position: Sitting, Cuff Size: Large)   Pulse 75   Temp (!) 97.5 F (36.4 C)   Ht 5\' 1"  (1.549 m)   Wt 264 lb 12.8 oz (120.1 kg)   SpO2 100%   BMI 50.03 kg/m   Wt Readings from Last 5 Encounters:  11/07/21 264 lb 12.8 oz (120.1 kg)  05/03/21 276 lb  (125.2 kg)  04/03/21 258 lb 3.2 oz (117.1 kg)     Physical Exam Vitals and nursing note reviewed.  Constitutional:      General: She is not in acute distress.    Appearance: She is well-developed.  Cardiovascular:     Rate and Rhythm: Normal rate and regular rhythm.  Pulmonary:     Effort: Pulmonary effort is normal.     Breath sounds: Normal breath sounds.  Neurological:     Mental Status: She is alert and oriented to person, place, and time.      Lab Results:  CBC    Component Value Date/Time   WBC 7.1 04/03/2021 1102   RBC 4.69 04/03/2021 1102   HGB 15.0 04/03/2021 1102   HCT 43.1 04/03/2021 1102   PLT 264 04/03/2021 1102   MCV 92 04/03/2021 1102   MCH 32.0 04/03/2021 1102   MCHC 34.8 04/03/2021 1102   RDW 14.1 04/03/2021 1102   LYMPHSABS 2.1 04/03/2021 1102   EOSABS 0.1 04/03/2021 1102   BASOSABS 0.0 04/03/2021 1102    BMET    Component Value Date/Time   NA 144 04/03/2021 1102   K 4.5 04/03/2021 1102   CL 102 04/03/2021 1102   CO2 24 04/03/2021 1102   GLUCOSE 84 04/03/2021 1102   BUN 13 04/03/2021 1102   CREATININE 1.03 (H) 04/03/2021 1102   CALCIUM 10.0 04/03/2021 1102      Assessment & Plan:   Primary hypertension - CBC - Comprehensive metabolic panel - metoprolol tartrate (LOPRESSOR) 25 MG tablet; Take 1 tablet (25 mg total) by mouth daily.  Dispense: 90 tablet; Refill: 0 - spironolactone-hydrochlorothiazide (ALDACTAZIDE) 25-25 MG tablet; Take 1 tablet by mouth daily.  Dispense: 30 tablet; Refill: 5   2. Acute right-sided low back pain, unspecified whether sciatica present  - predniSONE (DELTASONE) 20 MG tablet; Take 1 tablet (20 mg total) by mouth daily with breakfast for 5 days.  Dispense: 5 tablet; Refill: 0 - gabapentin (NEURONTIN) 100 MG capsule; Take 1 capsule (100 mg total) by mouth 3 (three) times daily.  Dispense: 90 capsule; Refill: 3    Follow up:  Follow up in 6 months or sooner     04/05/2021, NP 11/07/2021

## 2021-11-07 NOTE — Patient Instructions (Addendum)
1. Primary hypertension  - CBC - Comprehensive metabolic panel - metoprolol tartrate (LOPRESSOR) 25 MG tablet; Take 1 tablet (25 mg total) by mouth daily.  Dispense: 90 tablet; Refill: 0 - spironolactone-hydrochlorothiazide (ALDACTAZIDE) 25-25 MG tablet; Take 1 tablet by mouth daily.  Dispense: 30 tablet; Refill: 5   2. Acute right-sided low back pain, unspecified whether sciatica present  - predniSONE (DELTASONE) 20 MG tablet; Take 1 tablet (20 mg total) by mouth daily with breakfast for 5 days.  Dispense: 5 tablet; Refill: 0 - gabapentin (NEURONTIN) 100 MG capsule; Take 1 capsule (100 mg total) by mouth 3 (three) times daily.  Dispense: 90 capsule; Refill: 3    Follow up:  Follow up in 6 months or sooner

## 2021-11-08 LAB — COMPREHENSIVE METABOLIC PANEL
ALT: 16 IU/L (ref 0–32)
AST: 15 IU/L (ref 0–40)
Albumin/Globulin Ratio: 1.6 (ref 1.2–2.2)
Albumin: 4.3 g/dL (ref 3.9–4.9)
Alkaline Phosphatase: 66 IU/L (ref 44–121)
BUN/Creatinine Ratio: 14 (ref 9–23)
BUN: 14 mg/dL (ref 6–24)
Bilirubin Total: 0.7 mg/dL (ref 0.0–1.2)
CO2: 26 mmol/L (ref 20–29)
Calcium: 9.7 mg/dL (ref 8.7–10.2)
Chloride: 101 mmol/L (ref 96–106)
Creatinine, Ser: 1.02 mg/dL — ABNORMAL HIGH (ref 0.57–1.00)
Globulin, Total: 2.7 g/dL (ref 1.5–4.5)
Glucose: 93 mg/dL (ref 70–99)
Potassium: 5.1 mmol/L (ref 3.5–5.2)
Sodium: 144 mmol/L (ref 134–144)
Total Protein: 7 g/dL (ref 6.0–8.5)
eGFR: 70 mL/min/{1.73_m2} (ref >59)

## 2021-11-08 LAB — CBC
Hematocrit: 36.2 % (ref 34.0–46.6)
Hemoglobin: 12.2 g/dL (ref 11.1–15.9)
MCH: 32 pg (ref 26.6–33.0)
MCHC: 33.7 g/dL (ref 31.5–35.7)
MCV: 95 fL (ref 79–97)
Platelets: 317 10*3/uL (ref 150–450)
RBC: 3.81 x10E6/uL (ref 3.77–5.28)
RDW: 12 % (ref 11.7–15.4)
WBC: 7.2 10*3/uL (ref 3.4–10.8)

## 2021-11-21 ENCOUNTER — Other Ambulatory Visit: Payer: Self-pay

## 2021-11-21 ENCOUNTER — Other Ambulatory Visit: Payer: Self-pay | Admitting: Nurse Practitioner

## 2021-11-21 MED ORDER — TRAMADOL HCL 50 MG PO TABS: 50.0000 mg | ORAL_TABLET | Freq: Three times a day (TID) | ORAL | 0 refills | Status: DC | PRN

## 2022-01-01 ENCOUNTER — Ambulatory Visit
Admission: RE | Admit: 2022-01-01 | Discharge: 2022-01-01 | Disposition: A | Discharge status: Home / Self Care | Payer: Self-pay | Source: Ambulatory Visit | Attending: Physician Assistant | Admitting: Physician Assistant

## 2022-01-01 VITALS — BP 114/76 | HR 80 | Temp 98.6°F | Resp 18

## 2022-01-01 DIAGNOSIS — M25512 Pain in left shoulder: Secondary | ICD-10-CM | POA: Diagnosis not present

## 2022-01-01 MED ORDER — CYCLOBENZAPRINE HCL 10 MG PO TABS: 10.0000 mg | ORAL_TABLET | Freq: Two times a day (BID) | ORAL | 0 refills | Status: DC | PRN

## 2022-01-01 MED ORDER — PREDNISONE 20 MG PO TABS: 40.0000 mg | ORAL_TABLET | Freq: Every day | ORAL | 0 refills | Status: AC

## 2022-01-01 NOTE — ED Provider Notes (Signed)
[facility]    CSN: [id number] Arrival date & time: 01/01/22  0900      History   Chief Complaint Chief Complaint  Patient presents with   Shoulder Pain    HPI Miranda Charles is a 46 y.o. female.   Here today for evaluation of left shoulder pain that started a few days ago.  She denies any known injury but does states she lifts heavy objects at work frequently.  She is a Production designer, theatre/television/film at [employer] and notes she has repetitive lifting of 25 to 50 pounds at a time.  She denies any numbness or tingling.  She does report some cramping in her hand at times.  She has not had any elbow pain or wrist pain.  She has not taken any medication for symptoms.  She does report that pain is worse first thing in the morning.  The history is provided by the patient.    Past Medical History:  Diagnosis Date   Anemia    High cholesterol    HTN (hypertension)     Patient Active Problem List   Diagnosis Date Noted   Primary hypertension 11/07/2021   Class 3 severe obesity due to excess calories with serious comorbidity and body mass index (BMI) of 50.0 to 59.9 in adult (HCC) 04/03/2020   Nicotine dependence, cigarettes, uncomplicated 04/03/2020   Obstructive sleep apnea 04/03/2020   Lumbosacral radiculopathy at S1 07/27/2018   Meralgia paresthetica of left side 07/27/2018    History reviewed. No pertinent surgical history.  OB History   No obstetric history on file.      Home Medications    Prior to Admission medications   Medication Sig Start Date End Date Taking? Authorizing Provider  cyclobenzaprine (FLEXERIL) 10 MG tablet Take 1 tablet (10 mg total) by mouth 2 (two) times daily as needed for muscle spasms. 01/01/22  Yes Tomi Bamberger, PA-C  predniSONE (DELTASONE) 20 MG tablet Take 2 tablets (40 mg total) by mouth daily with breakfast for 5 days. 01/01/22 01/06/22 Yes Tomi Bamberger, PA-C  atorvastatin (LIPITOR) 20 MG tablet Take 2 tablets (40 mg total) by mouth  daily. 08/07/21 11/05/21  Orion Crook I, NP  cetirizine (ZYRTEC) 10 MG tablet Take 1 tablet (10 mg total) by mouth daily. 11/07/21   Ivonne Andrew, NP  Cholecalciferol 125 MCG (5000 UT) TABS Take by mouth.    [provider]  ferrous sulfate 325 (65 FE) MG tablet Take by mouth.    [provider]  gabapentin (NEURONTIN) 100 MG capsule Take 1 capsule (100 mg total) by mouth 3 (three) times daily. 11/07/21   Ivonne Andrew, NP  Lidocaine (HM LIDOCAINE PATCH) 4 % PTCH Apply 1 patch topically daily. Patient not taking: Reported on 05/03/2021 04/03/21   Orion Crook I, NP  metoprolol tartrate (LOPRESSOR) 25 MG tablet Take 1 tablet (25 mg total) by mouth daily. 11/07/21   Ivonne Andrew, NP  spironolactone-hydrochlorothiazide (ALDACTAZIDE) 25-25 MG tablet Take 1 tablet by mouth daily. 11/07/21 05/06/22  Ivonne Andrew, NP  traMADol (ULTRAM) 50 MG tablet Take 1 tablet (50 mg total) by mouth 3 (three) times daily as needed. 11/21/21   Ivonne Andrew, NP    Family History Family History  Family history unknown: Yes    Social History Social History   Tobacco Use   Smoking status: Some Days    Packs/day: 1.00    Types: Cigarettes   Smokeless tobacco: Never   Tobacco  comments:    1 pack every 3 to 4 days  Vaping Use   Vaping Use: Never used  Substance Use Topics   Alcohol use: Yes    Comment: social drinker   Drug use: Never     Allergies   Onion   Review of Systems Review of Systems  Constitutional:  Negative for chills and fever.  Eyes:  Negative for discharge and redness.  Gastrointestinal:  Negative for abdominal pain, nausea and vomiting.  Musculoskeletal:  Positive for arthralgias and myalgias.  Neurological:  Negative for numbness.     Physical Exam Triage Vital Signs ED Triage Vitals  Enc Vitals Group     BP      Pulse      Resp      Temp      Temp src      SpO2      Weight      Height      Head Circumference      Peak Flow       Pain Score      Pain Loc      Pain Edu?      Excl. in GC?    No data found.  Updated Vital Signs BP 114/76 (BP Location: Left Arm)   Pulse 80   Temp 98.6 F (37 C) (Oral)   Resp 18   LMP 12/20/2021   SpO2 96%     Physical Exam Vitals and nursing note reviewed.  Constitutional:      General: She is not in acute distress.    Appearance: Normal appearance. She is not ill-appearing.  HENT:     Head: Normocephalic and atraumatic.  Eyes:     Conjunctiva/sclera: Conjunctivae normal.  Cardiovascular:     Rate and Rhythm: Normal rate.  Pulmonary:     Effort: Pulmonary effort is normal.  Musculoskeletal:     Comments: TTP noted to lateral anterior left shoulder, AC joint area  Neurological:     Mental Status: She is alert.  Psychiatric:        Mood and Affect: Mood normal.        Behavior: Behavior normal.        Thought Content: Thought content normal.      UC Treatments / Results  Labs (all labs ordered are listed, but only abnormal results are displayed) Labs Reviewed - No data to display  EKG   Radiology No results found.  Procedures Procedures (including critical care time)  Medications Ordered in UC Medications - No data to display  Initial Impression / Assessment and Plan / UC Course  I have reviewed the triage vital signs and the nursing notes.  Pertinent labs & imaging results that were available during my care of the patient were reviewed by me and considered in my medical decision making (see chart for details).    Suspect inflammatory process given worse pain in the mornings after not using joint/ musculature. Will treat with steroid burst and muscle relaxer. Discussed that muscle relaxer could cause drowsiness and to use with caution. Advised against ibuprofen and aleve while taking prednisone but discussed that aleve may help with discomfort after she has completed steroids.   Final Clinical Impressions(s) / UC Diagnoses   Final diagnoses:   Acute pain of left shoulder   Discharge Instructions   None    ED Prescriptions     Medication Sig Dispense Auth. Provider   predniSONE (DELTASONE) 20 MG tablet Take 2 tablets (40  mg total) by mouth daily with breakfast for 5 days. 10 tablet Ewell Poe F, PA-C   cyclobenzaprine (FLEXERIL) 10 MG tablet Take 1 tablet (10 mg total) by mouth 2 (two) times daily as needed for muscle spasms. 20 tablet Francene Finders, PA-C      PDMP not reviewed this encounter.   Francene Finders, PA-C 01/01/22 1009

## 2022-01-01 NOTE — ED Triage Notes (Signed)
Pt presents with left shoulder pain X 2 days with no known injury.

## 2022-02-12 ENCOUNTER — Ambulatory Visit: Payer: Self-pay | Admitting: Nurse Practitioner

## 2022-02-25 ENCOUNTER — Ambulatory Visit
Admission: RE | Admit: 2022-02-25 | Discharge: 2022-02-25 | Disposition: A | Discharge status: Home / Self Care | Payer: Medicaid Other | Source: Ambulatory Visit | Attending: Internal Medicine | Admitting: Internal Medicine

## 2022-02-25 VITALS — BP 121/82 | HR 79 | Temp 98.4°F | Resp 20

## 2022-02-25 DIAGNOSIS — Z20822 Contact with and (suspected) exposure to covid-19: Secondary | ICD-10-CM | POA: Insufficient documentation

## 2022-02-25 DIAGNOSIS — J069 Acute upper respiratory infection, unspecified: Secondary | ICD-10-CM | POA: Insufficient documentation

## 2022-02-25 MED ORDER — BENZONATATE 100 MG PO CAPS: 100.0000 mg | ORAL_CAPSULE | Freq: Three times a day (TID) | ORAL | 0 refills | Status: DC | PRN

## 2022-02-25 MED ORDER — FLUTICASONE PROPIONATE 50 MCG/ACT NA SUSP: 1.0000 | Freq: Every day | NASAL | 0 refills | Status: DC

## 2022-02-25 NOTE — ED Triage Notes (Signed)
Pt presents with cough, nasal congestion x 1 day. Denies HA, body aches, fever. Wife is COVID+.

## 2022-02-25 NOTE — Discharge Instructions (Signed)
I am highly suspicious that you have COVID-19 given your close exposure.  COVID test is pending.  We will call if it is positive.  In the meantime, I have sent you medications to help alleviate symptoms.  This should run its course and self resolve with symptomatic treatment.  Please follow-up if symptoms persist or worsen.

## 2022-02-25 NOTE — ED Provider Notes (Signed)
[facility]    CSN: [id number] Arrival date & time: 02/25/22  1102      History   Chief Complaint Chief Complaint  Patient presents with   Chills    HPI Miranda Charles is a 46 y.o. female.   Patient presents with nasal congestion, cough, headache, generalized body aches that started yesterday.  Patient reports that wife recently tested positive for COVID-19.  Patient has not completed any rapid test at home.  Denies fever, chest pain, shortness of breath, nausea, vomiting, diarrhea, abdominal pain.  Has taken Mucinex with minimal improvement of symptoms.  Patient denies formal diagnosis of asthma or COPD.     Past Medical History:  Diagnosis Date   Anemia    High cholesterol    HTN (hypertension)     Patient Active Problem List   Diagnosis Date Noted   Primary hypertension 11/07/2021   Class 3 severe obesity due to excess calories with serious comorbidity and body mass index (BMI) of 50.0 to 59.9 in adult (HCC) 04/03/2020   Nicotine dependence, cigarettes, uncomplicated 04/03/2020   Obstructive sleep apnea 04/03/2020   Lumbosacral radiculopathy at S1 07/27/2018   Meralgia paresthetica of left side 07/27/2018    History reviewed. No pertinent surgical history.  OB History   No obstetric history on file.      Home Medications    Prior to Admission medications   Medication Sig Start Date End Date Taking? Authorizing Provider  atorvastatin (LIPITOR) 20 MG tablet Take 2 tablets (40 mg total) by mouth daily. 08/07/21 02/25/22 Yes [place], Enid Derry I, NP  benzonatate (TESSALON) 100 MG capsule Take 1 capsule (100 mg total) by mouth every 8 (eight) hours as needed for cough. 02/25/22  Yes Crystol Walpole, Acie Fredrickson, FNP  cetirizine (ZYRTEC) 10 MG tablet Take 1 tablet (10 mg total) by mouth daily. 11/07/21  Yes Ivonne Andrew, NP  ferrous sulfate 325 (65 FE) MG tablet Take by mouth.   Yes [provider]  fluticasone (FLONASE) 50 MCG/ACT nasal spray Place 1 spray  into both nostrils daily. 02/25/22  Yes Jamoni Hewes, Rolly Salter E, FNP  metoprolol tartrate (LOPRESSOR) 25 MG tablet Take 1 tablet (25 mg total) by mouth daily. 11/07/21  Yes Ivonne Andrew, NP  spironolactone-hydrochlorothiazide (ALDACTAZIDE) 25-25 MG tablet Take 1 tablet by mouth daily. 11/07/21 05/06/22 Yes Ivonne Andrew, NP  Cholecalciferol 125 MCG (5000 UT) TABS Take by mouth.    [provider]  cyclobenzaprine (FLEXERIL) 10 MG tablet Take 1 tablet (10 mg total) by mouth 2 (two) times daily as needed for muscle spasms. 01/01/22   Tomi Bamberger, PA-C  gabapentin (NEURONTIN) 100 MG capsule Take 1 capsule (100 mg total) by mouth 3 (three) times daily. 11/07/21   Ivonne Andrew, NP  Lidocaine (HM LIDOCAINE PATCH) 4 % PTCH Apply 1 patch topically daily. Patient not taking: Reported on 05/03/2021 04/03/21   Orion Crook I, NP  traMADol (ULTRAM) 50 MG tablet Take 1 tablet (50 mg total) by mouth 3 (three) times daily as needed. 11/21/21   Ivonne Andrew, NP    Family History Family History  Family history unknown: Yes    Social History Social History   Tobacco Use   Smoking status: Some Days    Packs/day: 1.00    Types: Cigarettes   Smokeless tobacco: Never   Tobacco comments:    1 pack every 3 to 4 days  Vaping Use   Vaping Use: Never used  Substance Use Topics  Alcohol use: Yes    Comment: social drinker   Drug use: Not Currently     Allergies   Onion   Review of Systems Review of Systems Per HPI  Physical Exam Triage Vital Signs ED Triage Vitals  Enc Vitals Group     BP 02/25/22 1129 121/82     Pulse Rate 02/25/22 1129 79     Resp 02/25/22 1129 20     Temp 02/25/22 1129 98.4 F (36.9 C)     Temp Source 02/25/22 1129 Oral     SpO2 02/25/22 1129 97 %     Weight --      Height --      Head Circumference --      Peak Flow --      Pain Score 02/25/22 1122 0     Pain Loc --      Pain Edu? --      Excl. in GC? --    No data found.  Updated Vital  Signs BP 121/82 (BP Location: Left Arm)   Pulse 79   Temp 98.4 F (36.9 C) (Oral)   Resp 20   LMP 02/21/2022 (Exact Date)   SpO2 97%   Visual Acuity Right Eye Distance:   Left Eye Distance:   Bilateral Distance:    Right Eye Near:   Left Eye Near:    Bilateral Near:     Physical Exam Constitutional:      General: She is not in acute distress.    Appearance: Normal appearance. She is not toxic-appearing or diaphoretic.  HENT:     Head: Normocephalic and atraumatic.     Right Ear: Tympanic membrane and ear canal normal.     Left Ear: Tympanic membrane and ear canal normal.     Nose: Congestion present.     Mouth/Throat:     Mouth: Mucous membranes are moist.     Pharynx: Posterior oropharyngeal erythema present.  Eyes:     Extraocular Movements: Extraocular movements intact.     Conjunctiva/sclera: Conjunctivae normal.     Pupils: Pupils are equal, round, and reactive to light.  Cardiovascular:     Rate and Rhythm: Normal rate and regular rhythm.     Pulses: Normal pulses.     Heart sounds: Normal heart sounds.  Pulmonary:     Effort: Pulmonary effort is normal. No respiratory distress.     Breath sounds: Normal breath sounds. No stridor. No wheezing, rhonchi or rales.  Abdominal:     General: Abdomen is flat. Bowel sounds are normal.     Palpations: Abdomen is soft.  Musculoskeletal:        General: Normal range of motion.     Cervical back: Normal range of motion.  Skin:    General: Skin is warm and dry.  Neurological:     General: No focal deficit present.     Mental Status: She is alert and oriented to person, place, and time. Mental status is at baseline.  Psychiatric:        Mood and Affect: Mood normal.        Behavior: Behavior normal.      UC Treatments / Results  Labs (all labs ordered are listed, but only abnormal results are displayed) Labs Reviewed  SARS CORONAVIRUS 2 (TAT 6-24 HRS)    EKG   Radiology No results  found.  Procedures Procedures (including critical care time)  Medications Ordered in UC Medications - No data to display  Initial Impression /  Assessment and Plan / UC Course  I have reviewed the triage vital signs and the nursing notes.  Pertinent labs & imaging results that were available during my care of the patient were reviewed by me and considered in my medical decision making (see chart for details).     Patient presents with symptoms likely from a viral upper respiratory infection. Differential includes bacterial pneumonia, sinusitis, allergic rhinitis, covid 19, flu, RSV. Highly suspicious of covid 19 given patient's close exposure.  Do not suspect underlying cardiopulmonary process. Symptoms seem unlikely related to ACS, CHF or COPD exacerbations, pneumonia, pneumothorax. Patient is nontoxic appearing and not in need of emergent medical intervention. Covid pcr pending.   Recommended symptom control with over the counter medications that are safe with HTN. Patient sent prescriptions. If covid test is positive, patient is inside the treatment window, and is interested in covid antivirals she may qualify for paxlovid as last GFR in August is 70. May benefit from monitoring BP more closely at home, though as paxlovid can interact with metoprolol.   Return if symptoms fail to improve in 1-2 weeks or you develop shortness of breath, chest pain, severe headache. Patient states understanding and is agreeable.  Discharged with PCP followup.  Final Clinical Impressions(s) / UC Diagnoses   Final diagnoses:  Close exposure to COVID-19 virus  Viral upper respiratory tract infection with cough     Discharge Instructions      I am highly suspicious that you have COVID-19 given your close exposure.  COVID test is pending.  We will call if it is positive.  In the meantime, I have sent you medications to help alleviate symptoms.  This should run its course and self resolve with symptomatic  treatment.  Please follow-up if symptoms persist or worsen.    ED Prescriptions     Medication Sig Dispense Auth. Provider   fluticasone (FLONASE) 50 MCG/ACT nasal spray Place 1 spray into both nostrils daily. 16 g Arlisa Leclere, Rolly Salter E, [place]   benzonatate (TESSALON) 100 MG capsule Take 1 capsule (100 mg total) by mouth every 8 (eight) hours as needed for cough. 21 capsule [place], Acie Fredrickson, [place]      PDMP not reviewed this encounter.   Gustavus Bryant, [place] 02/25/22 1244

## 2022-02-26 ENCOUNTER — Telehealth (HOSPITAL_COMMUNITY): Payer: Self-pay | Admitting: Emergency Medicine

## 2022-02-26 LAB — SARS CORONAVIRUS 2 (TAT 6-24 HRS): SARS Coronavirus 2: POSITIVE — AB

## 2022-02-26 MED ORDER — NIRMATRELVIR/RITONAVIR (PAXLOVID)TABLET: 3.0000 | ORAL_TABLET | Freq: Two times a day (BID) | ORAL | 0 refills | Status: AC

## 2022-03-13 ENCOUNTER — Ambulatory Visit: Payer: Medicaid Other | Admitting: Nurse Practitioner

## 2022-03-28 ENCOUNTER — Encounter (HOSPITAL_COMMUNITY): Payer: Self-pay

## 2022-03-28 ENCOUNTER — Encounter: Payer: Self-pay | Admitting: Nurse Practitioner

## 2022-03-28 ENCOUNTER — Other Ambulatory Visit: Payer: Self-pay

## 2022-03-28 ENCOUNTER — Emergency Department (HOSPITAL_COMMUNITY)
Admission: EM | Admit: 2022-03-28 | Discharge: 2022-03-29 | Discharge status: Against Medical Advice | Payer: Medicaid Other | Attending: Emergency Medicine | Admitting: Emergency Medicine

## 2022-03-28 ENCOUNTER — Ambulatory Visit: Payer: Medicaid Other | Admitting: Nurse Practitioner

## 2022-03-28 ENCOUNTER — Other Ambulatory Visit: Payer: Self-pay | Admitting: Nurse Practitioner

## 2022-03-28 ENCOUNTER — Emergency Department (HOSPITAL_COMMUNITY): Payer: Medicaid Other

## 2022-03-28 VITALS — BP 112/77 | HR 77 | Temp 97.6°F | Ht 61.0 in | Wt 268.0 lb

## 2022-03-28 DIAGNOSIS — E7849 Other hyperlipidemia: Secondary | ICD-10-CM | POA: Diagnosis not present

## 2022-03-28 DIAGNOSIS — M79671 Pain in right foot: Secondary | ICD-10-CM

## 2022-03-28 DIAGNOSIS — I10 Essential (primary) hypertension: Secondary | ICD-10-CM | POA: Diagnosis not present

## 2022-03-28 DIAGNOSIS — M25571 Pain in right ankle and joints of right foot: Secondary | ICD-10-CM | POA: Insufficient documentation

## 2022-03-28 DIAGNOSIS — M2550 Pain in unspecified joint: Secondary | ICD-10-CM | POA: Diagnosis not present

## 2022-03-28 DIAGNOSIS — Z5321 Procedure and treatment not carried out due to patient leaving prior to being seen by health care provider: Secondary | ICD-10-CM | POA: Diagnosis not present

## 2022-03-28 MED ORDER — FERROUS SULFATE 325 (65 FE) MG PO TABS: 325.0000 mg | ORAL_TABLET | Freq: Every day | ORAL | 2 refills | Status: DC

## 2022-03-28 MED ORDER — PREDNISONE 20 MG PO TABS: 20.0000 mg | ORAL_TABLET | Freq: Every day | ORAL | 0 refills | Status: AC

## 2022-03-28 MED ORDER — ATORVASTATIN CALCIUM 20 MG PO TABS: 40.0000 mg | ORAL_TABLET | Freq: Every day | ORAL | 2 refills | Status: DC

## 2022-03-28 MED ORDER — BENZONATATE 100 MG PO CAPS: 100.0000 mg | ORAL_CAPSULE | Freq: Three times a day (TID) | ORAL | 0 refills | Status: DC | PRN

## 2022-03-28 MED ORDER — SPIRONOLACTONE-HCTZ 25-25 MG PO TABS: 1.0000 | ORAL_TABLET | Freq: Every day | ORAL | 5 refills | Status: DC

## 2022-03-28 NOTE — ED Provider Triage Note (Signed)
Emergency Medicine Provider Triage Evaluation Note  Miranda Charles , a 47 y.o. female  was evaluated in triage.  Pt complains of right foot and heel pain.  Has been getting worse for the past 5 months.  Denies injury or trauma.  Saw her primary care provider and states that this provider referred her to the ED for x-ray of the right foot.  Chart review states that this is not the case and that outpatient x-ray was ordered and ambulatory referral to podiatry was ordered.  Prednisone was also ordered..  Review of Systems  Positive: As above Negative: As above  Physical Exam  BP (!) 141/95 (BP Location: Right Arm)   Pulse 67   Temp 98.3 F (36.8 C) (Oral)   Resp 18   SpO2 100%  Gen:   Awake, no distress   Resp:  Normal effort  MSK:   Moves extremities without difficulty  Other:  Right heel tenderness to palpation  Medical Decision Making  Medically screening exam initiated at 2:09 PM.  Appropriate orders placed.  Mazal Ebey was informed that the remainder of the evaluation will be completed by another provider, this initial triage assessment does not replace that evaluation, and the importance of remaining in the ED until their evaluation is complete.  X-ray ordered   Roylene Reason, PA-C 03/28/22 1410

## 2022-03-28 NOTE — Progress Notes (Unsigned)
@Patient  ID: Miranda Charles, female    DOB: Aug 09, 1975, 47 y.o.   MRN: [id number]  Chief Complaint  Patient presents with   Follow-up    Follow-up and right heel--painful,tingling, its getting worseespecially first thing in the morning. 5 months    Referring provider: Fenton Foy, NP   HPI  Miranda Charles 47 y.o. female  has a past medical history of Anemia, High cholesterol, and HTN (hypertension).  To the [facility] for follow up of chronic illness and back pain.   Right heel pin 5 months - seems to be getting worse      Allergies  Allergen Reactions   Onion Anaphylaxis and Swelling    "throat closes"     Immunization History  Administered Date(s) Administered   Influenza,inj,Quad PF,6+ Mos 04/03/2020   Unspecified SARS-COV-2 Vaccination 08/31/2019    Past Medical History:  Diagnosis Date   Anemia    High cholesterol    HTN (hypertension)     Tobacco History: Social History   Tobacco Use  Smoking Status Some Days   Packs/day: 1.00   Types: Cigarettes  Smokeless Tobacco Never  Tobacco Comments   1 pack every 3 to 4 days   Ready to quit: Not Answered Counseling given: Not Answered Tobacco comments: 1 pack every 3 to 4 days   Outpatient Encounter Medications as of 03/28/2022  Medication Sig   cetirizine (ZYRTEC) 10 MG tablet Take 1 tablet (10 mg total) by mouth daily.   Cholecalciferol 125 MCG (5000 UT) TABS Take by mouth.   metoprolol tartrate (LOPRESSOR) 25 MG tablet Take 1 tablet (25 mg total) by mouth daily.   predniSONE (DELTASONE) 20 MG tablet Take 1 tablet (20 mg total) by mouth daily with breakfast for 5 days.   traMADol (ULTRAM) 50 MG tablet Take 1 tablet (50 mg total) by mouth 3 (three) times daily as needed.   [DISCONTINUED] ferrous sulfate 325 (65 FE) MG tablet Take by mouth.   [DISCONTINUED] spironolactone-hydrochlorothiazide (ALDACTAZIDE) 25-25 MG tablet Take 1 tablet by mouth daily.   atorvastatin (LIPITOR) 20 MG tablet Take 2 tablets (40 mg  total) by mouth daily.   benzonatate (TESSALON) 100 MG capsule Take 1 capsule (100 mg total) by mouth every 8 (eight) hours as needed for cough.   ferrous sulfate 325 (65 FE) MG tablet Take 1 tablet (325 mg total) by mouth daily with breakfast.   spironolactone-hydrochlorothiazide (ALDACTAZIDE) 25-25 MG tablet Take 1 tablet by mouth daily.   [DISCONTINUED] atorvastatin (LIPITOR) 20 MG tablet Take 2 tablets (40 mg total) by mouth daily.   [DISCONTINUED] benzonatate (TESSALON) 100 MG capsule Take 1 capsule (100 mg total) by mouth every 8 (eight) hours as needed for cough.   [DISCONTINUED] cyclobenzaprine (FLEXERIL) 10 MG tablet Take 1 tablet (10 mg total) by mouth 2 (two) times daily as needed for muscle spasms.   [DISCONTINUED] fluticasone (FLONASE) 50 MCG/ACT nasal spray Place 1 spray into both nostrils daily.   [DISCONTINUED] gabapentin (NEURONTIN) 100 MG capsule Take 1 capsule (100 mg total) by mouth 3 (three) times daily.   [DISCONTINUED] Lidocaine (HM LIDOCAINE PATCH) 4 % PTCH Apply 1 patch topically daily. (Patient not taking: Reported on 05/03/2021)   No facility-administered encounter medications on file as of 03/28/2022.     Review of Systems  Review of Systems     Physical Exam  BP 112/77   Pulse 77   Temp 97.6 F (36.4 C)   Ht 5\' 1"  (1.549 m)   Wt 268 lb (  121.6 kg)   SpO2 98%   BMI 50.64 kg/m   Wt Readings from Last 5 Encounters:  03/28/22 268 lb (121.6 kg)  11/07/21 264 lb 12.8 oz (120.1 kg)  05/03/21 276 lb (125.2 kg)  04/03/21 258 lb 3.2 oz (117.1 kg)     Physical Exam    Assessment & Plan:   No problem-specific Assessment & Plan notes found for this encounter.     Fenton Foy, NP 03/28/2022

## 2022-03-28 NOTE — Patient Instructions (Signed)
1. Right foot pain  - DG Foot Complete Right; Future - Ambulatory referral to Podiatry - predniSONE (DELTASONE) 20 MG tablet; Take 1 tablet (20 mg total) by mouth daily with breakfast for 5 days.  Dispense: 5 tablet; Refill: 0  2. Primary hypertension  - CBC - Comprehensive metabolic panel - spironolactone-hydrochlorothiazide (ALDACTAZIDE) 25-25 MG tablet; Take 1 tablet by mouth daily.  Dispense: 30 tablet; Refill: 5  3. Other hyperlipidemia  - Lipid Panel - atorvastatin (LIPITOR) 20 MG tablet; Take 2 tablets (40 mg total) by mouth daily.  Dispense: 60 tablet; Refill: 2  4. Multiple joint pain  - Rheumatoid factor - C-reactive protein - ANA  Follow up:  Follow up in 6 months or sooner if needed

## 2022-03-28 NOTE — ED Triage Notes (Signed)
Pt c/o right heel and ankle pain x5 months, getting progressively worse. Pt denies falls/injury/trauma.

## 2022-03-29 LAB — LIPID PANEL
Chol/HDL Ratio: 5.2 ratio — ABNORMAL HIGH (ref 0.0–4.4)
Cholesterol, Total: 213 mg/dL — ABNORMAL HIGH (ref 100–199)
HDL: 41 mg/dL (ref >39)
LDL Chol Calc (NIH): 143 mg/dL — ABNORMAL HIGH (ref 0–99)
Triglycerides: 162 mg/dL — ABNORMAL HIGH (ref 0–149)
VLDL Cholesterol Cal: 29 mg/dL (ref 5–40)

## 2022-03-29 LAB — COMPREHENSIVE METABOLIC PANEL
ALT: 18 IU/L (ref 0–32)
AST: 13 IU/L (ref 0–40)
Albumin/Globulin Ratio: 1.5 (ref 1.2–2.2)
Albumin: 4.3 g/dL (ref 3.9–4.9)
Alkaline Phosphatase: 60 IU/L (ref 44–121)
BUN/Creatinine Ratio: 15 (ref 9–23)
BUN: 15 mg/dL (ref 6–24)
Bilirubin Total: 0.4 mg/dL (ref 0.0–1.2)
CO2: 26 mmol/L (ref 20–29)
Calcium: 9.8 mg/dL (ref 8.7–10.2)
Chloride: 101 mmol/L (ref 96–106)
Creatinine, Ser: 1.02 mg/dL — ABNORMAL HIGH (ref 0.57–1.00)
Globulin, Total: 2.8 g/dL (ref 1.5–4.5)
Glucose: 87 mg/dL (ref 70–99)
Potassium: 4.6 mmol/L (ref 3.5–5.2)
Sodium: 140 mmol/L (ref 134–144)
Total Protein: 7.1 g/dL (ref 6.0–8.5)
eGFR: 69 mL/min/{1.73_m2} (ref >59)

## 2022-03-29 LAB — C-REACTIVE PROTEIN: CRP: 17 mg/L — ABNORMAL HIGH (ref 0–10)

## 2022-03-29 LAB — CBC
Hematocrit: 34.4 % (ref 34.0–46.6)
Hemoglobin: 11.4 g/dL (ref 11.1–15.9)
MCH: 32.3 pg (ref 26.6–33.0)
MCHC: 33.1 g/dL (ref 31.5–35.7)
MCV: 98 fL — ABNORMAL HIGH (ref 79–97)
Platelets: 318 10*3/uL (ref 150–450)
RBC: 3.53 x10E6/uL — ABNORMAL LOW (ref 3.77–5.28)
RDW: 11.9 % (ref 11.7–15.4)
WBC: 7.2 10*3/uL (ref 3.4–10.8)

## 2022-03-29 LAB — ANA: Anti Nuclear Antibody (ANA): POSITIVE — AB

## 2022-03-29 LAB — RHEUMATOID FACTOR: Rheumatoid fact SerPl-aCnc: 146.3 IU/mL — ABNORMAL HIGH (ref <14.0)

## 2022-03-31 ENCOUNTER — Telehealth: Payer: Self-pay

## 2022-03-31 ENCOUNTER — Encounter: Payer: Self-pay | Admitting: Nurse Practitioner

## 2022-03-31 DIAGNOSIS — M79671 Pain in right foot: Secondary | ICD-10-CM | POA: Insufficient documentation

## 2022-03-31 NOTE — Telephone Encounter (Signed)
Transition Care Management Unsuccessful Follow-up Telephone Call  Date of discharge and from where:  03/29/22  Attempts:  1st Attempt  Reason for unsuccessful TCM follow-up call:  Unable to reach patient  Elyse Jarvis RMA

## 2022-03-31 NOTE — Assessment & Plan Note (Signed)
-  DG Foot Complete Right; Future - Ambulatory referral to Podiatry - predniSONE (DELTASONE) 20 MG tablet; Take 1 tablet (20 mg total) by mouth daily with breakfast for 5 days.  Dispense: 5 tablet; Refill: 0  2. Primary hypertension  - CBC - Comprehensive metabolic panel - spironolactone-hydrochlorothiazide (ALDACTAZIDE) 25-25 MG tablet; Take 1 tablet by mouth daily.  Dispense: 30 tablet; Refill: 5  3. Other hyperlipidemia  - Lipid Panel - atorvastatin (LIPITOR) 20 MG tablet; Take 2 tablets (40 mg total) by mouth daily.  Dispense: 60 tablet; Refill: 2  4. Multiple joint pain  - Rheumatoid factor - C-reactive protein - ANA  Follow up:  Follow up in 6 months or sooner if needed

## 2022-04-02 ENCOUNTER — Other Ambulatory Visit: Payer: Self-pay

## 2022-04-02 ENCOUNTER — Other Ambulatory Visit: Payer: Self-pay | Admitting: Nurse Practitioner

## 2022-04-02 DIAGNOSIS — M2550 Pain in unspecified joint: Secondary | ICD-10-CM

## 2022-04-02 DIAGNOSIS — E7849 Other hyperlipidemia: Secondary | ICD-10-CM

## 2022-04-02 DIAGNOSIS — M0580 Other rheumatoid arthritis with rheumatoid factor of unspecified site: Secondary | ICD-10-CM

## 2022-04-02 DIAGNOSIS — R768 Other specified abnormal immunological findings in serum: Secondary | ICD-10-CM

## 2022-04-02 MED ORDER — ATORVASTATIN CALCIUM 20 MG PO TABS: 40.0000 mg | ORAL_TABLET | Freq: Every day | ORAL | 2 refills | Status: DC

## 2022-04-02 NOTE — Telephone Encounter (Signed)
From: Domingo Mend To: Office of Fenton Foy, NP Sent: 04/02/2022 2:11 PM EST Subject: Medication Renewal Request  Refills have been requested for the following medications:   traMADol (ULTRAM) 50 MG tablet Miranda Charles]  Preferred pharmacy: [facility] [place], [place] - [address] AT [place] Delivery method: Arlyss Gandy

## 2022-05-31 ENCOUNTER — Other Ambulatory Visit: Payer: Self-pay

## 2022-05-31 ENCOUNTER — Ambulatory Visit
Admission: RE | Admit: 2022-05-31 | Discharge: 2022-05-31 | Disposition: A | Discharge status: Home / Self Care | Payer: Medicaid Other | Source: Ambulatory Visit | Attending: Emergency Medicine | Admitting: Emergency Medicine

## 2022-05-31 VITALS — BP 130/91 | HR 86 | Temp 97.9°F | Resp 18

## 2022-05-31 DIAGNOSIS — M722 Plantar fascial fibromatosis: Secondary | ICD-10-CM | POA: Diagnosis not present

## 2022-05-31 MED ORDER — DEXAMETHASONE SODIUM PHOSPHATE 10 MG/ML IJ SOLN
10.0000 mg | Freq: Once | INTRAMUSCULAR | Status: AC
  Administered 2022-05-31: 10 mg via INTRAMUSCULAR

## 2022-05-31 NOTE — ED Triage Notes (Signed)
Pt here for right heel pain x months worse over last several days; denies obvious injury

## 2022-05-31 NOTE — ED Provider Notes (Addendum)
[place]   [id number] 05/31/22 Arrival Time: 1232   Chief Complaint  Patient presents with   Foot Pain    Can hardly walk or press down on right hill - Entered by patient     SUBJECTIVE: History from: patient.  Miranda Charles is a 47 y.o. female presented to the urgent care with a complaint of right foot pain for the past few months.  Denies any aggravating factors, trauma or injury.  Has had x-ray in January 2024 that showed bony spurs.  Pain is worse with range of motion.  Report symptoms that is ongoing and is not resolving.  Denies fever, chills, fatigue, SOB, wheezing, chest pain, nausea, changes in bowel or bladder habits.     ROS: As per HPI.  All other pertinent ROS negative.      Past Medical History:  Diagnosis Date   Anemia    High cholesterol    HTN (hypertension)    History reviewed. No pertinent surgical history. Allergies  Allergen Reactions   Onion Anaphylaxis and Swelling    "throat closes"    No current facility-administered medications on file prior to encounter.   Current Outpatient Medications on File Prior to Encounter  Medication Sig Dispense Refill   atorvastatin (LIPITOR) 20 MG tablet Take 2 tablets (40 mg total) by mouth daily. 60 tablet 2   benzonatate (TESSALON) 100 MG capsule Take 1 capsule (100 mg total) by mouth every 8 (eight) hours as needed for cough. 21 capsule 0   cetirizine (ZYRTEC) 10 MG tablet Take 1 tablet (10 mg total) by mouth daily. 90 tablet 0   Cholecalciferol 125 MCG (5000 UT) TABS Take by mouth.     ferrous sulfate 325 (65 FE) MG tablet Take 1 tablet (325 mg total) by mouth daily with breakfast. 90 tablet 2   metoprolol tartrate (LOPRESSOR) 25 MG tablet Take 1 tablet (25 mg total) by mouth daily. 90 tablet 0   spironolactone-hydrochlorothiazide (ALDACTAZIDE) 25-25 MG tablet Take 1 tablet by mouth daily. 30 tablet 5   traMADol (ULTRAM) 50 MG tablet Take 1 tablet (50 mg total) by mouth 3 (three) times daily as  needed. 20 tablet 0   Social History   Socioeconomic History   Marital status: Married    Spouse name: Not on file   Number of children: Not on file   Years of education: Not on file   Highest education level: Not on file  Occupational History   Not on file  Tobacco Use   Smoking status: Some Days    Packs/day: 1    Types: Cigarettes   Smokeless tobacco: Never   Tobacco comments:    1 pack every 3 to 4 days  Vaping Use   Vaping Use: Never used  Substance and Sexual Activity   Alcohol use: Yes    Comment: social drinker   Drug use: Not Currently   Sexual activity: Yes  Other Topics Concern   Not on file  Social History Narrative   Not on file   Social Determinants of Health   Financial Resource Strain: Not on file  Food Insecurity: Not on file  Transportation Needs: Not on file  Physical Activity: Not on file  Stress: Not on file  Social Connections: Not on file  Intimate Partner Violence: Not on file   Family History  Family history unknown: Yes    OBJECTIVE:  Vitals:   05/31/22 1315  BP: (!) 130/91  Pulse: 86  Resp: 18  Temp:  97.9 F (36.6 C)  TempSrc: Oral  SpO2: 97%     Physical Exam Vitals and nursing note reviewed.  Constitutional:      General: She is not in acute distress.    Appearance: Normal appearance. She is normal weight. She is not ill-appearing, toxic-appearing or diaphoretic.  HENT:     Head: Normocephalic.  Cardiovascular:     Rate and Rhythm: Normal rate and regular rhythm.     Pulses: Normal pulses.     Heart sounds: Normal heart sounds. No murmur heard.    No friction rub. No gallop.  Pulmonary:     Effort: Pulmonary effort is normal. No respiratory distress.     Breath sounds: Normal breath sounds. No stridor. No wheezing, rhonchi or rales.  Chest:     Chest wall: No tenderness.  Musculoskeletal:     Right foot: Tenderness present.     Left foot: Normal.     Comments: Tenderness present under the sole of right foot  while palpating the plantar fascia.  Normal range of motion with pain.  She is still able to bear weight and ambulate with pain.  No surface trauma, ecchymosis, erythema, lesion or ulcer present  Neurological:     Mental Status: She is alert and oriented to person, place, and time.   Tenderness  LABS:  No results found for this or any previous visit (from the past 24 hour(s)).     RADIOLOGY DG Foot Complete Right  Result Date: 03/28/2022 CLINICAL DATA:  Pain x few months EXAM: RIGHT FOOT COMPLETE - 3+ VIEW COMPARISON:  None Available. FINDINGS: No recent fracture or dislocation is seen. Small smooth marginated calcification at the tip of medial malleolus may be residual from previous injury. No degenerative changes with bony spurs are seen in intertarsal joints between the navicular and cuneiform. IMPRESSION: No recent fracture or dislocation is seen. Possible old ununited fracture is seen in the tip of medial malleolus. Degenerative changes are noted in the intertarsal joints between cuneiform and navicular. Electronically Signed   By: Elmer Picker M.D.   On: 03/28/2022 14:48     ASSESSMENT & PLAN:  1. Plantar fasciitis of right foot     Meds ordered this encounter  Medications   dexamethasone (DECADRON) injection 10 mg    Discharge instructions  Dexamethasone 10 mg was given in office Continue to take Tylenol as needed for pain May roll bottle of ice under feet to help alleviate pain Wear good supportive shoes Call or go to the ED if you have any new or worsening symptoms    Reviewed expectations re: course of current medical issues. Questions answered. Outlined signs and symptoms indicating need for more acute intervention. Patient verbalized understanding. After Visit Summary given.          Emerson Monte, FNP 05/31/22 1428    Emerson Monte, FNP 05/31/22 1431

## 2022-05-31 NOTE — Discharge Instructions (Addendum)
Dexamethasone 10 mg was given in office Continue to take Tylenol as needed for pain May roll bottle of ice under feet to help alleviate pain Wear good supportive shoes Call or go to the ED if you have any new or worsening symptoms

## 2022-09-08 ENCOUNTER — Ambulatory Visit
Admission: RE | Admit: 2022-09-08 | Discharge: 2022-09-08 | Disposition: A | Discharge status: Home / Self Care | Payer: Medicaid Other | Source: Ambulatory Visit | Attending: Emergency Medicine | Admitting: Emergency Medicine

## 2022-09-08 VITALS — BP 150/95 | HR 81 | Temp 98.6°F | Resp 18

## 2022-09-08 DIAGNOSIS — B9689 Other specified bacterial agents as the cause of diseases classified elsewhere: Secondary | ICD-10-CM

## 2022-09-08 DIAGNOSIS — J019 Acute sinusitis, unspecified: Secondary | ICD-10-CM | POA: Diagnosis not present

## 2022-09-08 MED ORDER — DOXYCYCLINE HYCLATE 100 MG PO CAPS: 100.0000 mg | ORAL_CAPSULE | Freq: Two times a day (BID) | ORAL | 0 refills | Status: DC

## 2022-09-08 MED ORDER — IPRATROPIUM-ALBUTEROL 0.5-2.5 (3) MG/3ML IN SOLN
3.0000 mL | Freq: Once | RESPIRATORY_TRACT | Status: AC
  Administered 2022-09-08: 3 mL via RESPIRATORY_TRACT

## 2022-09-08 MED ORDER — BENZONATATE 100 MG PO CAPS: 100.0000 mg | ORAL_CAPSULE | Freq: Three times a day (TID) | ORAL | 0 refills | Status: DC | PRN

## 2022-09-08 NOTE — ED Triage Notes (Signed)
Pt c/o productive cough (green sputum)  x2 weeks. No relief with OTC meds. Endorses mild sore throat.

## 2022-09-08 NOTE — ED Provider Notes (Signed)
[facility]    CSN: [id number] Arrival date & time: 09/08/22  1453      History   Chief Complaint Chief Complaint  Patient presents with   Cough    Thoart hurts congestion bad cough - Entered by patient    HPI Miranda Charles is a 47 y.o. female. Pt c/o productive cough (green sputum) x2 weeks, has been sick for 3 to 4 weeks. No relief with OTC meds; has tried Alka-Seltzer cold medicine and generic Mucus Relief medicines. Endorses mild sore throat.  Denies shortness of breath or wheezing.  Does report postnasal drainage.  Is a smoker.   Cough   Past Medical History:  Diagnosis Date   Anemia    High cholesterol    HTN (hypertension)     Patient Active Problem List   Diagnosis Date Noted   Right foot pain 03/31/2022   Primary hypertension 11/07/2021   Class 3 severe obesity due to excess calories with serious comorbidity and body mass index (BMI) of 50.0 to 59.9 in adult (HCC) 04/03/2020   Nicotine dependence, cigarettes, uncomplicated 04/03/2020   Obstructive sleep apnea 04/03/2020   Lumbosacral radiculopathy at S1 07/27/2018   Meralgia paresthetica of left side 07/27/2018    History reviewed. No pertinent surgical history.  OB History   No obstetric history on file.      Home Medications    Prior to Admission medications   Medication Sig Start Date End Date Taking? Authorizing Provider  Cholecalciferol 125 MCG (5000 UT) TABS Take by mouth.   Yes [provider]  doxycycline (VIBRAMYCIN) 100 MG capsule Take 1 capsule (100 mg total) by mouth 2 (two) times daily. 09/08/22  Yes Cathlyn Parsons, NP  ferrous sulfate 325 (65 FE) MG tablet Take 1 tablet (325 mg total) by mouth daily with breakfast. 03/28/22  Yes Ivonne Andrew, NP  metoprolol tartrate (LOPRESSOR) 25 MG tablet Take 1 tablet (25 mg total) by mouth daily. 11/07/21  Yes Ivonne Andrew, NP  spironolactone-hydrochlorothiazide (ALDACTAZIDE) 25-25 MG tablet Take 1 tablet by mouth daily.  03/28/22 09/24/22 Yes Ivonne Andrew, NP  atorvastatin (LIPITOR) 20 MG tablet Take 2 tablets (40 mg total) by mouth daily. 04/02/22 07/01/22  Ivonne Andrew, NP  benzonatate (TESSALON) 100 MG capsule Take 1 capsule (100 mg total) by mouth every 8 (eight) hours as needed for cough. 09/08/22   Cathlyn Parsons, NP  cetirizine (ZYRTEC) 10 MG tablet Take 1 tablet (10 mg total) by mouth daily. 11/07/21   Ivonne Andrew, NP  traMADol (ULTRAM) 50 MG tablet Take 1 tablet (50 mg total) by mouth 3 (three) times daily as needed. 11/21/21   Ivonne Andrew, NP    Family History Family History  Family history unknown: Yes    Social History Social History   Tobacco Use   Smoking status: Some Days    Packs/day: .5    Types: Cigarettes   Smokeless tobacco: Never   Tobacco comments:    1 pack every 3 to 4 days  Vaping Use   Vaping Use: Never used  Substance Use Topics   Alcohol use: Yes    Comment: social drinker   Drug use: Not Currently     Allergies   Onion   Review of Systems Review of Systems  Respiratory:  Positive for cough.      Physical Exam Triage Vital Signs ED Triage Vitals  Enc Vitals Group     BP 09/08/22 1510 (!) 150/95  Pulse Rate 09/08/22 1510 81     Resp 09/08/22 1510 18     Temp 09/08/22 1510 98.6 F (37 C)     Temp Source 09/08/22 1510 Oral     SpO2 09/08/22 1510 98 %     Weight --      Height --      Head Circumference --      Peak Flow --      Pain Score 09/08/22 1507 0     Pain Loc --      Pain Edu? --      Excl. in GC? --    No data found.  Updated Vital Signs BP (!) 150/95 (BP Location: Right Arm)   Pulse 81   Temp 98.6 F (37 C) (Oral)   Resp 18   LMP 09/07/2022 (Exact Date)   SpO2 98%   Visual Acuity Right Eye Distance:   Left Eye Distance:   Bilateral Distance:    Right Eye Near:   Left Eye Near:    Bilateral Near:     Physical Exam Constitutional:      General: She is not in acute distress.    Appearance: Normal  appearance. She is not ill-appearing.  HENT:     Right Ear: Tympanic membrane, ear canal and external ear normal.     Left Ear: Tympanic membrane, ear canal and external ear normal.     Nose: Congestion present.     Right Sinus: Maxillary sinus tenderness present. No frontal sinus tenderness.     Left Sinus: Maxillary sinus tenderness present. No frontal sinus tenderness.     Mouth/Throat:     Mouth: Mucous membranes are moist.     Pharynx: Oropharynx is clear. No oropharyngeal exudate or posterior oropharyngeal erythema.  Cardiovascular:     Rate and Rhythm: Normal rate and regular rhythm.  Pulmonary:     Effort: Pulmonary effort is normal.     Breath sounds: Normal breath sounds.     Comments: Frequent coughing spasms observed Lymphadenopathy:     Head:     Right side of head: No submandibular adenopathy.     Left side of head: No submandibular adenopathy.     Cervical: No cervical adenopathy.  Neurological:     Mental Status: She is alert.      UC Treatments / Results  Labs (all labs ordered are listed, but only abnormal results are displayed) Labs Reviewed - No data to display  EKG   Radiology No results found.  Procedures Procedures (including critical care time)  Medications Ordered in UC Medications  ipratropium-albuterol (DUONEB) 0.5-2.5 (3) MG/3ML nebulizer solution 3 mL (3 mLs Nebulization Given 09/08/22 1536)    Initial Impression / Assessment and Plan / UC Course  I have reviewed the triage vital signs and the nursing notes.  Pertinent labs & imaging results that were available during my care of the patient were reviewed by me and considered in my medical decision making (see chart for details).    Since patient is a smoker and reports coughing up green sputum as well as blowing green sputum from her nose, we tried a DuoNeb to see if it helped her coughing.  She did not feel like it made a difference.  Her lungs sound the same before and after the  treatment.  I suspect that her coughing up green sputum is related to postnasal drainage from a sinus infection.  Discussed supportive care.  Final Clinical Impressions(s) / UC Diagnoses  Final diagnoses:  Acute bacterial sinusitis     Discharge Instructions      Continue to use over the counter cold medicines such as Alka-Seltzer to help manage your symptoms until you get better.  I recommend using Mucinex (generic guaifenesin is okay to use) and saline nasal spray to help relieve your congestion and help it to drain as well.  You can use saline nasal spray several times a day  Please finish all the antibiotics even if you are feeling better.  I have also prescribed cough medicine to help with your coughing   ED Prescriptions     Medication Sig Dispense Auth. Provider   doxycycline (VIBRAMYCIN) 100 MG capsule Take 1 capsule (100 mg total) by mouth 2 (two) times daily. 20 capsule Cathlyn Parsons, NP   benzonatate (TESSALON) 100 MG capsule Take 1 capsule (100 mg total) by mouth every 8 (eight) hours as needed for cough. 21 capsule Cathlyn Parsons, NP      PDMP not reviewed this encounter.   Cathlyn Parsons, NP 09/08/22 1600

## 2022-09-08 NOTE — Discharge Instructions (Addendum)
Continue to use over the counter cold medicines such as Alka-Seltzer to help manage your symptoms until you get better.  I recommend using Mucinex (generic guaifenesin is okay to use) and saline nasal spray to help relieve your congestion and help it to drain as well.  You can use saline nasal spray several times a day  Please finish all the antibiotics even if you are feeling better.  I have also prescribed cough medicine to help with your coughing

## 2022-09-21 NOTE — Progress Notes (Deleted)
Office Visit Note  Patient: Miranda Charles             Date of Birth: [birth year]           MRN: [id number]             PCP: Ivonne Andrew, NP Referring: Ivonne Andrew, NP Visit Date: 10/02/2022 Occupation: @GUAROCC @  Subjective:  No chief complaint on file.   History of Present Illness: Miranda Charles is a 47 y.o. female ***     Activities of Daily Living:  Patient reports morning stiffness for *** {minute/hour:19697}.   Patient {ACTIONS;DENIES/REPORTS:21021675::"Denies"} nocturnal pain.  Difficulty dressing/grooming: {ACTIONS;DENIES/REPORTS:21021675::"Denies"} Difficulty climbing stairs: {ACTIONS;DENIES/REPORTS:21021675::"Denies"} Difficulty getting out of chair: {ACTIONS;DENIES/REPORTS:21021675::"Denies"} Difficulty using hands for taps, buttons, cutlery, and/or writing: {ACTIONS;DENIES/REPORTS:21021675::"Denies"}  No Rheumatology ROS completed.   PMFS History:  Patient Active Problem List   Diagnosis Date Noted   Right foot pain 03/31/2022   Primary hypertension 11/07/2021   Class 3 severe obesity due to excess calories with serious comorbidity and body mass index (BMI) of 50.0 to 59.9 in adult (HCC) 04/03/2020   Nicotine dependence, cigarettes, uncomplicated 04/03/2020   Obstructive sleep apnea 04/03/2020   Lumbosacral radiculopathy at S1 07/27/2018   Meralgia paresthetica of left side 07/27/2018    Past Medical History:  Diagnosis Date   Anemia    High cholesterol    HTN (hypertension)     Family History  Family history unknown: Yes   No past surgical history on file. Social History   Social History Narrative   Not on file   Immunization History  Administered Date(s) Administered   Influenza,inj,Quad PF,6+ Mos 04/03/2020   Unspecified SARS-COV-2 Vaccination 08/31/2019     Objective: Vital Signs: LMP 09/07/2022 (Exact Date)    Physical Exam   Musculoskeletal Exam: ***  CDAI Exam: CDAI Score: -- Patient Global: --; Provider Global:  -- Swollen: --; Tender: -- Joint Exam 10/02/2022   No joint exam has been documented for this visit   There is currently no information documented on the homunculus. Go to the Rheumatology activity and complete the homunculus joint exam.  Investigation: No additional findings.  Imaging: No results found.  Recent Labs: Lab Results  Component Value Date   WBC 7.2 03/28/2022   HGB 11.4 03/28/2022   PLT 318 03/28/2022   NA 140 03/28/2022   K 4.6 03/28/2022   CL 101 03/28/2022   CO2 26 03/28/2022   GLUCOSE 87 03/28/2022   BUN 15 03/28/2022   CREATININE 1.02 (H) 03/28/2022   BILITOT 0.4 03/28/2022   ALKPHOS 60 03/28/2022   AST 13 03/28/2022   ALT 18 03/28/2022   PROT 7.1 03/28/2022   ALBUMIN 4.3 03/28/2022   CALCIUM 9.8 03/28/2022    Speciality Comments: No specialty comments available.  Procedures:  No procedures performed Allergies: Onion   Assessment / Plan:     Visit Diagnoses: Polyarthritis with positive rheumatoid factor (HCC) - 03/28/22: Positive ANA, RF 146.3, CRP 17  Multiple joint pain  Positive ANA (antinuclear antibody)  Primary hypertension  Obstructive sleep apnea  Meralgia paresthetica of left side  Lumbosacral radiculopathy at S1  Nicotine dependence, cigarettes, uncomplicated  Class 3 severe obesity due to excess calories with serious comorbidity and body mass index (BMI) of 50.0 to 59.9 in adult [facility])  Orders: No orders of the defined types were placed in this encounter.  No orders of the defined types were placed in this encounter.   Face-to-face time spent with patient was *** minutes.  Greater than 50% of time was spent in counseling and coordination of care.  Follow-Up Instructions: No follow-ups on file.   Gearldine Bienenstock, PA-C  Note - This record has been created using Dragon software.  Chart creation errors have been sought, but may not always  have been located. Such creation errors do not reflect on  the standard of medical  care.

## 2022-09-26 ENCOUNTER — Ambulatory Visit: Payer: Self-pay | Admitting: Nurse Practitioner

## 2022-10-02 ENCOUNTER — Encounter: Payer: Medicaid Other | Admitting: Rheumatology

## 2022-10-02 DIAGNOSIS — M0580 Other rheumatoid arthritis with rheumatoid factor of unspecified site: Secondary | ICD-10-CM

## 2022-10-02 DIAGNOSIS — F17210 Nicotine dependence, cigarettes, uncomplicated: Secondary | ICD-10-CM

## 2022-10-02 DIAGNOSIS — M2550 Pain in unspecified joint: Secondary | ICD-10-CM

## 2022-10-02 DIAGNOSIS — R768 Other specified abnormal immunological findings in serum: Secondary | ICD-10-CM

## 2022-10-02 DIAGNOSIS — I10 Essential (primary) hypertension: Secondary | ICD-10-CM

## 2022-10-02 DIAGNOSIS — M5417 Radiculopathy, lumbosacral region: Secondary | ICD-10-CM

## 2022-10-02 DIAGNOSIS — G4733 Obstructive sleep apnea (adult) (pediatric): Secondary | ICD-10-CM

## 2022-10-02 DIAGNOSIS — G5712 Meralgia paresthetica, left lower limb: Secondary | ICD-10-CM

## 2022-10-08 ENCOUNTER — Ambulatory Visit (HOSPITAL_COMMUNITY)
Admission: RE | Admit: 2022-10-08 | Discharge: 2022-10-08 | Disposition: A | Discharge status: Home / Self Care | Payer: Medicaid Other | Source: Ambulatory Visit | Attending: Nurse Practitioner | Admitting: Nurse Practitioner

## 2022-10-08 ENCOUNTER — Encounter (HOSPITAL_COMMUNITY): Payer: Self-pay

## 2022-10-08 ENCOUNTER — Ambulatory Visit: Payer: Self-pay

## 2022-10-08 DIAGNOSIS — M7918 Myalgia, other site: Secondary | ICD-10-CM | POA: Diagnosis not present

## 2022-10-08 MED ORDER — TIZANIDINE HCL 4 MG PO TABS: 4.0000 mg | ORAL_TABLET | Freq: Three times a day (TID) | ORAL | 0 refills | Status: DC | PRN

## 2022-10-08 MED ORDER — KETOROLAC TROMETHAMINE 30 MG/ML IJ SOLN
30.0000 mg | Freq: Once | INTRAMUSCULAR | Status: AC
  Administered 2022-10-08: 30 mg via INTRAMUSCULAR

## 2022-10-08 MED ORDER — KETOROLAC TROMETHAMINE 30 MG/ML IJ SOLN
INTRAMUSCULAR | Status: AC
  Filled 2022-10-08: qty 1
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
## 2022-10-17 ENCOUNTER — Other Ambulatory Visit: Payer: Self-pay | Admitting: Nurse Practitioner

## 2022-10-17 NOTE — Telephone Encounter (Signed)
Urgent Care patient Requested Prescriptions  Pending Prescriptions Disp Refills   tiZANidine (ZANAFLEX) 4 MG tablet [Pharmacy Med Name: TIZANIDINE 4MG  TABLETS] 30 tablet 0    Sig: TAKE 1 TABLET BY MOUTH EVERY 8 HOURS AS NEEDED FOR MUSCLE SPASMS(DONT USE ALCOHOL OR DRIVING WHILE ON THIS)     Not Delegated - Cardiovascular:  Alpha-2 Agonists - tizanidine Failed - 10/17/2022 12:51 PM      Failed - This refill cannot be delegated      Failed - Valid encounter within last 6 months    Recent Outpatient Visits   None     Future Appointments             In 5 months Miranda Savoy, MD [facility]

## 2022-10-24 ENCOUNTER — Ambulatory Visit: Payer: Medicaid Other | Admitting: Rheumatology

## 2022-10-26 IMAGING — DX DG THORACIC SPINE 2V
3 series · 3 of 3 positions shown · non-contrast
Comparison: None.

CLINICAL DATA: Fall, pain, initial encounter.

EXAM:
THORACIC SPINE 2 VIEWS

[thoracic spine standing ap]
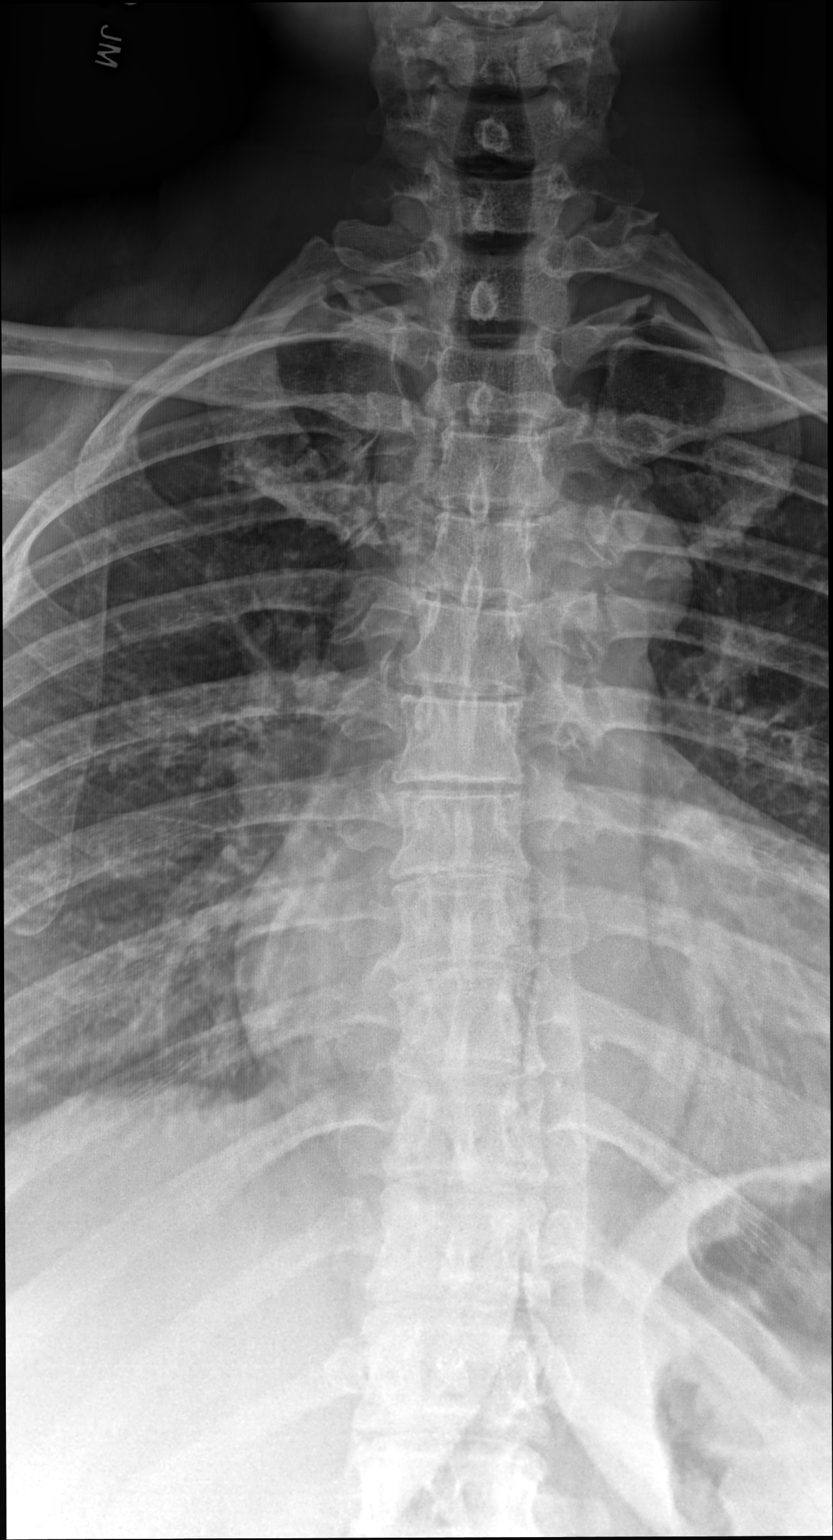

[thoracic spine standing lat]
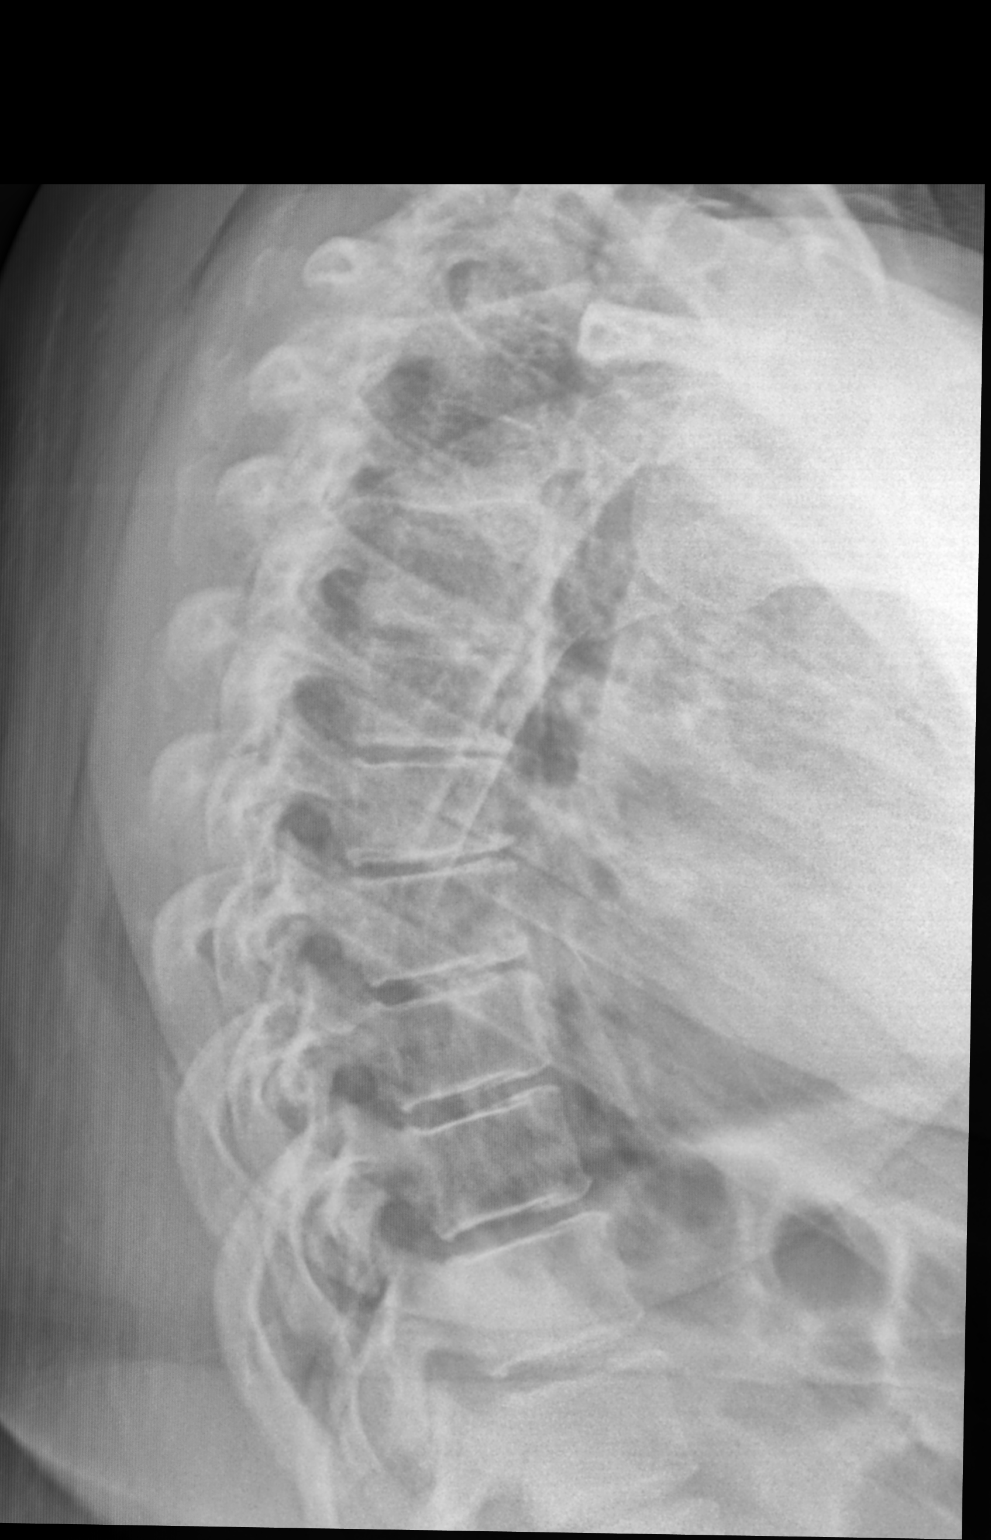

[cervico-thoracic transitional area]
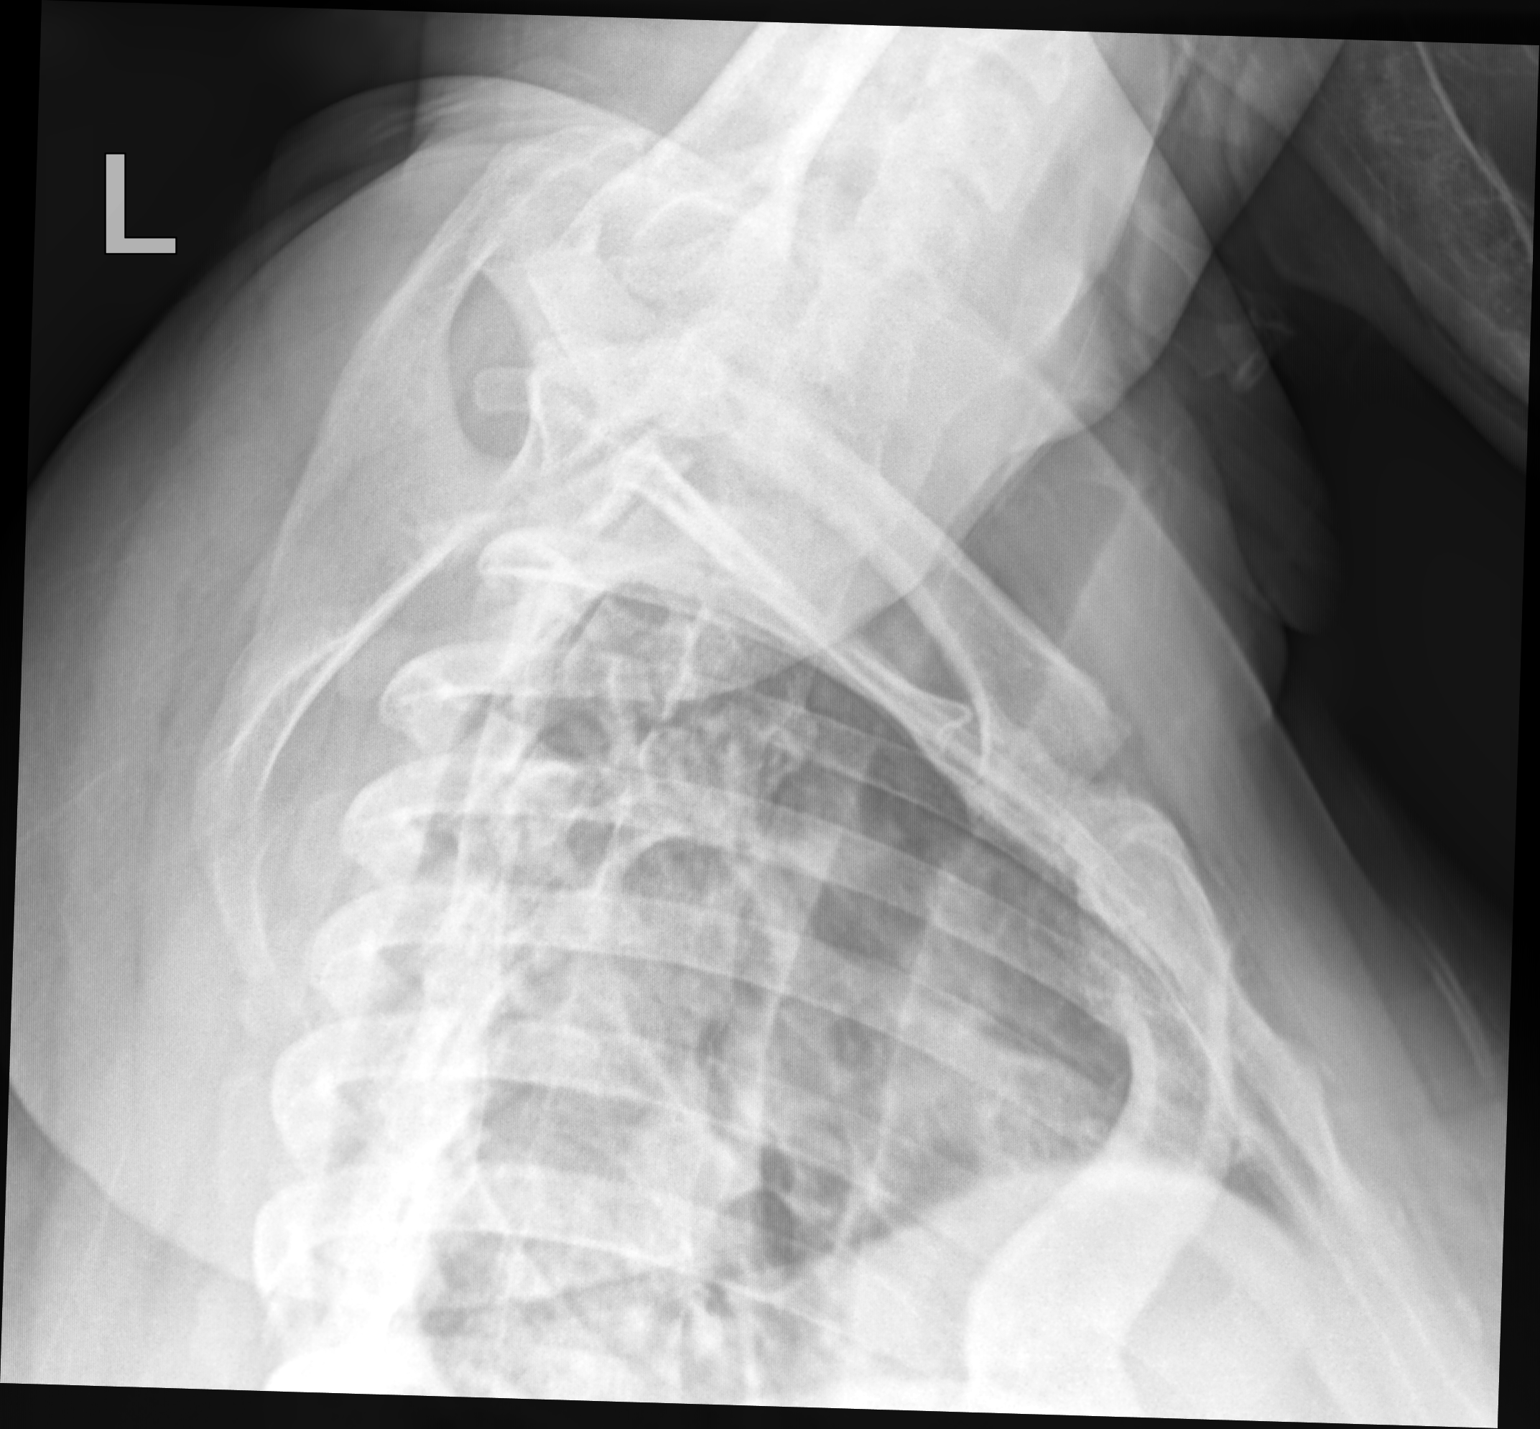

[3 of 3 positions shown; findings below may reference images not displayed]

FINDINGS: Alignment is anatomic. Vertebral body and disc space height are
maintained. Very mild endplate degenerative changes in the
midthoracic spine. Visualized chest is unremarkable.
IMPRESSION: No acute findings.

## 2022-10-26 IMAGING — DX DG SHOULDER 2+V*R*
5 series · 5 of 5 positions shown · non-contrast
Comparison: 01/04/2021.

CLINICAL DATA: Fall, pain, initial encounter.

EXAM:
RIGHT SHOULDER - 2+ VIEW

[shoulder internal rotation ap (1 of 2)]
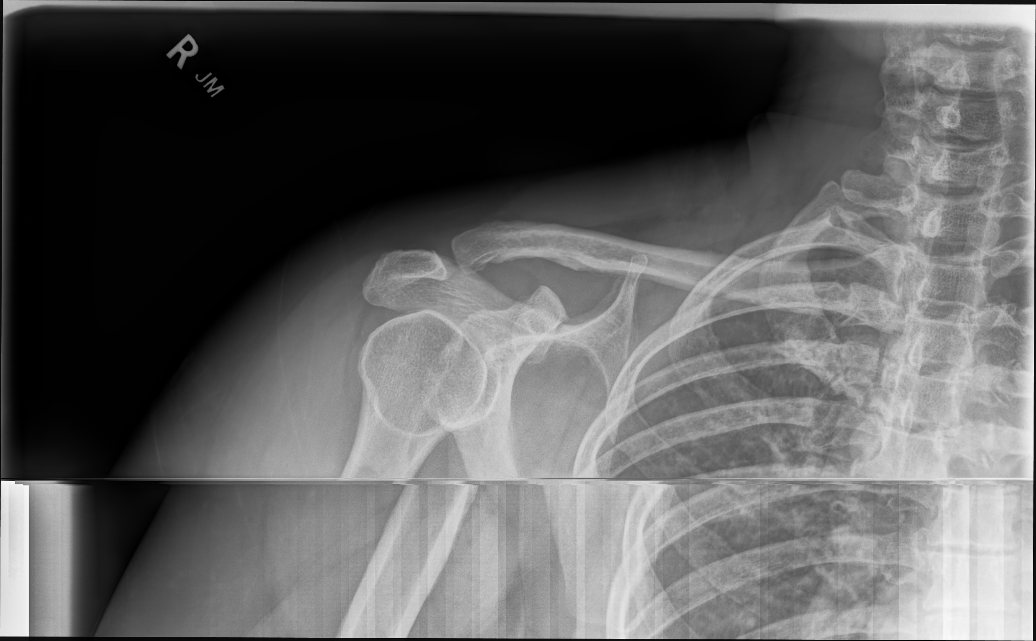

[shoulder internal rotation ap (2 of 2)]
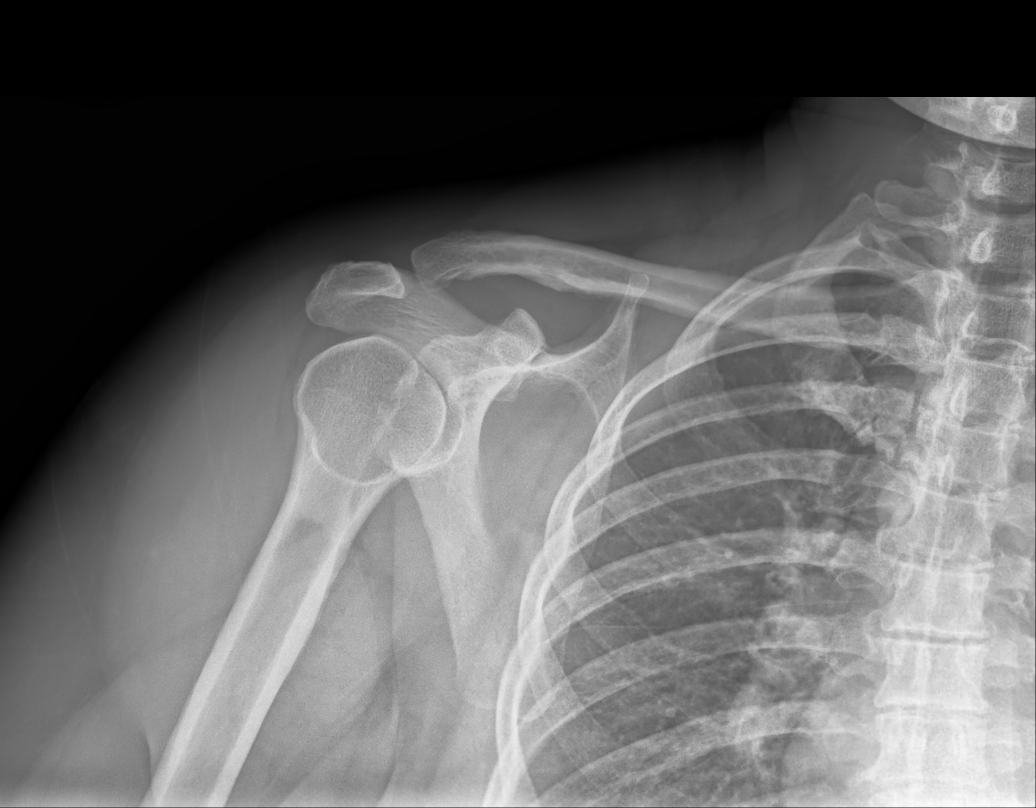

[shoulder external rotation ap]
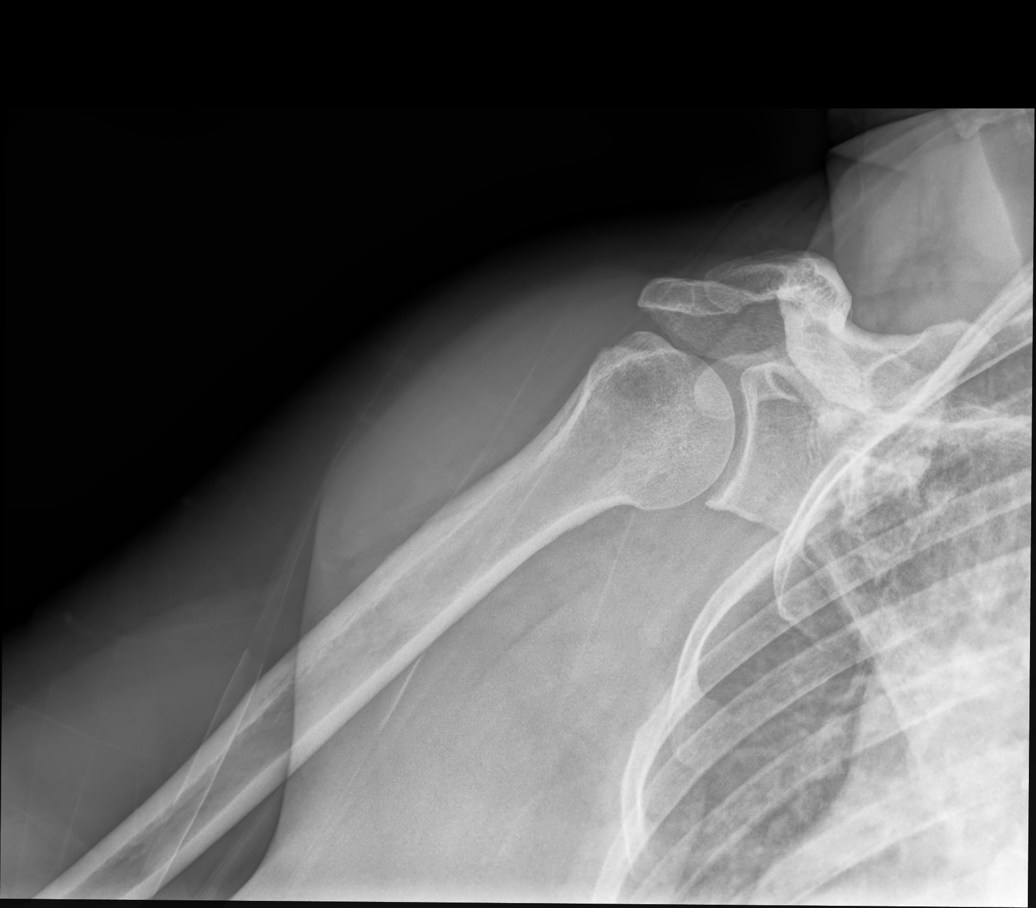

[shoulder transscapular y view (neer)]
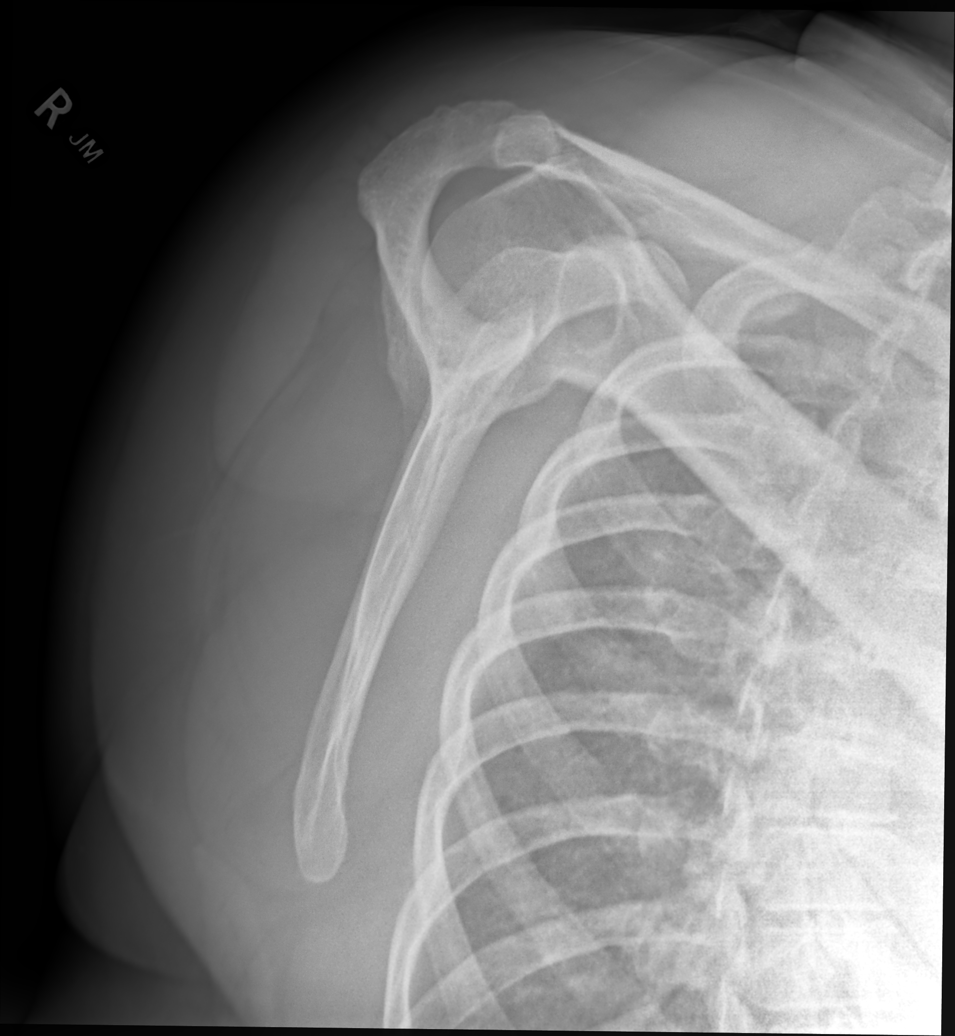

[shoulder supraspinatus outlet view]
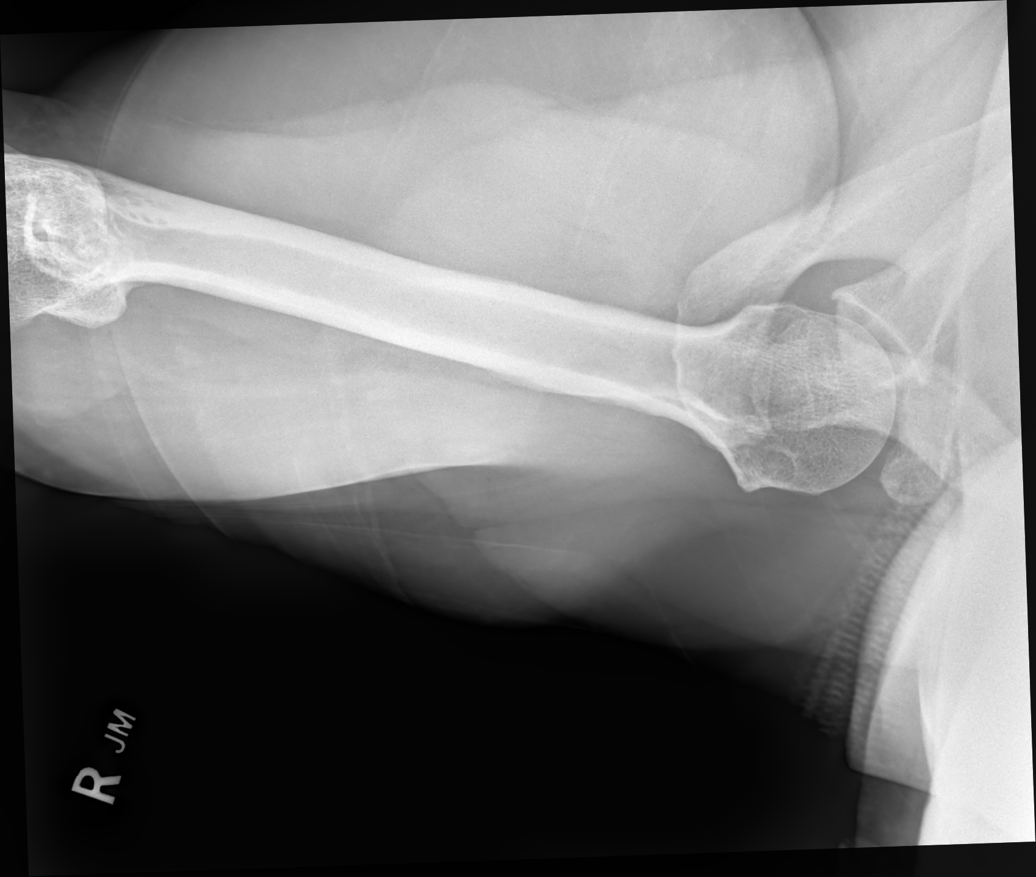

[5 of 5 positions shown; findings below may reference images not displayed]

FINDINGS: No acute osseous or joint abnormality. Visualized portion of the
right chest is unremarkable.
IMPRESSION: Negative.

## 2022-10-26 IMAGING — DX DG LUMBAR SPINE COMPLETE 4+V
5 series · 5 of 5 positions shown · non-contrast
Comparison: None.

CLINICAL DATA: Fall, low back pain, initial encounter.

EXAM:
LUMBAR SPINE - COMPLETE 4+ VIEW

[lumbar spine ap]
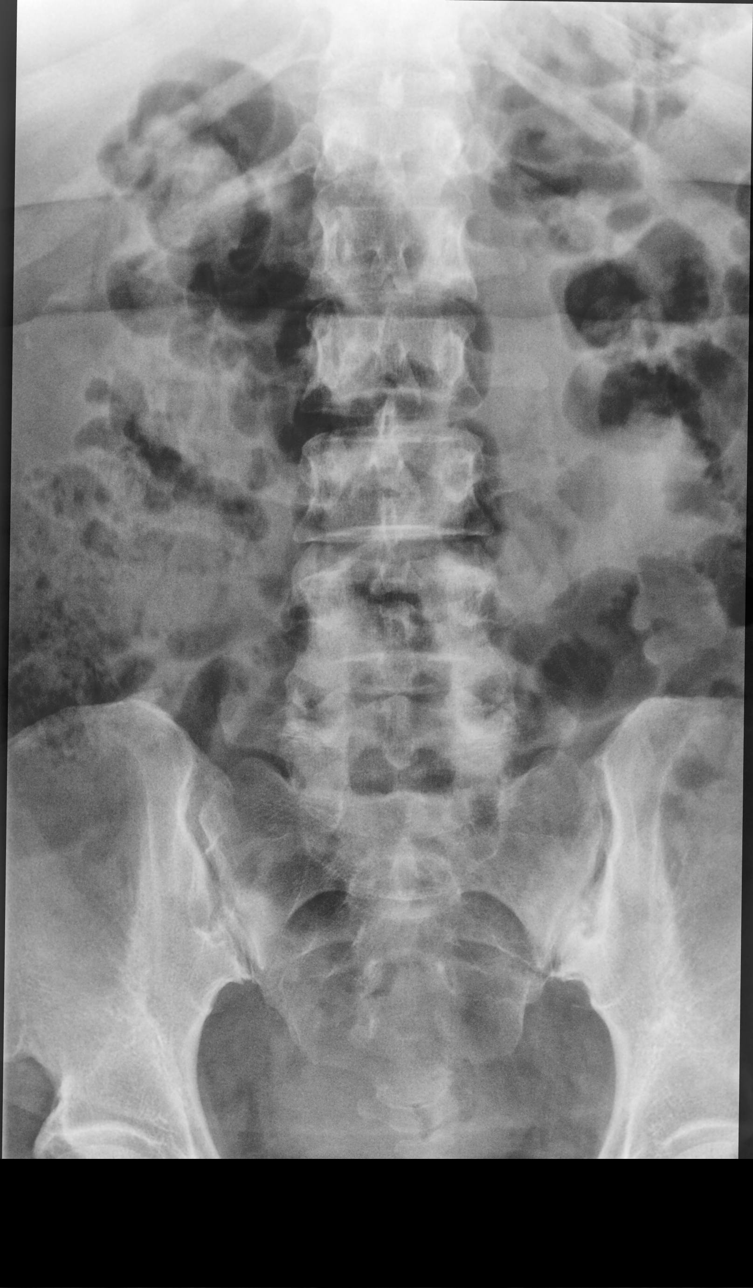

[lumbar spine oblique supine (1 of 2)]
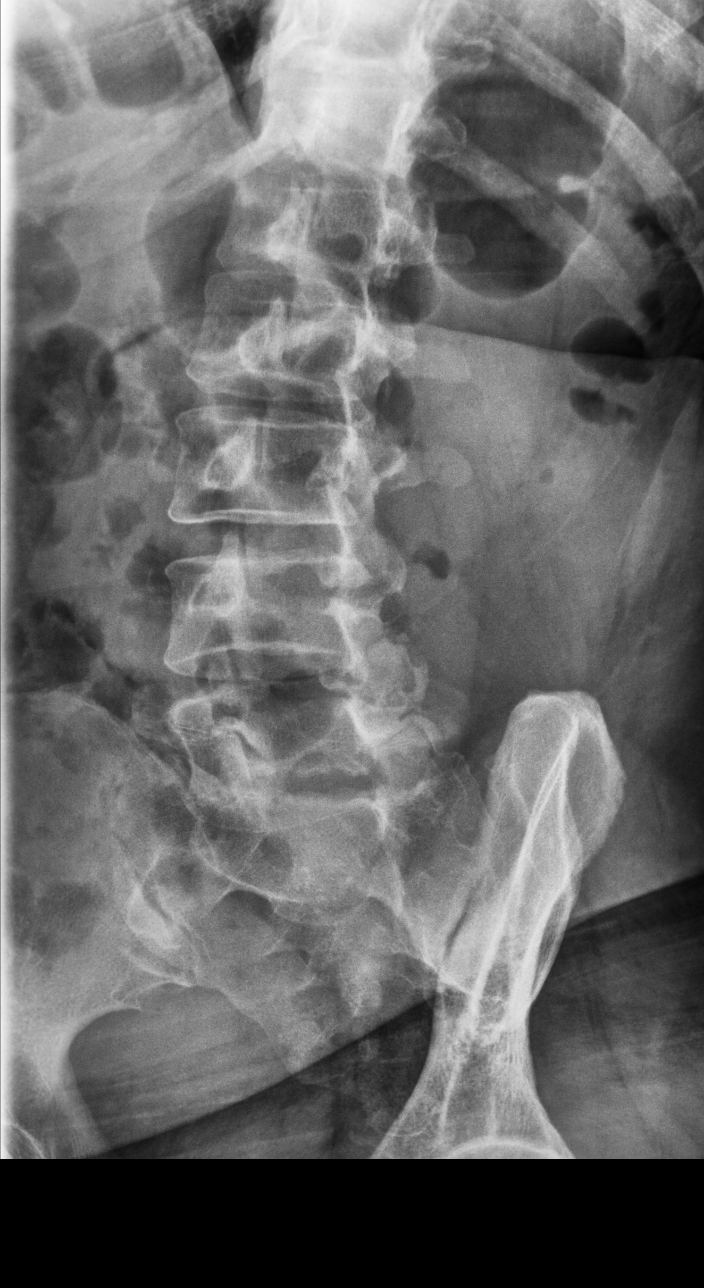

[lumbar spine oblique supine (2 of 2)]
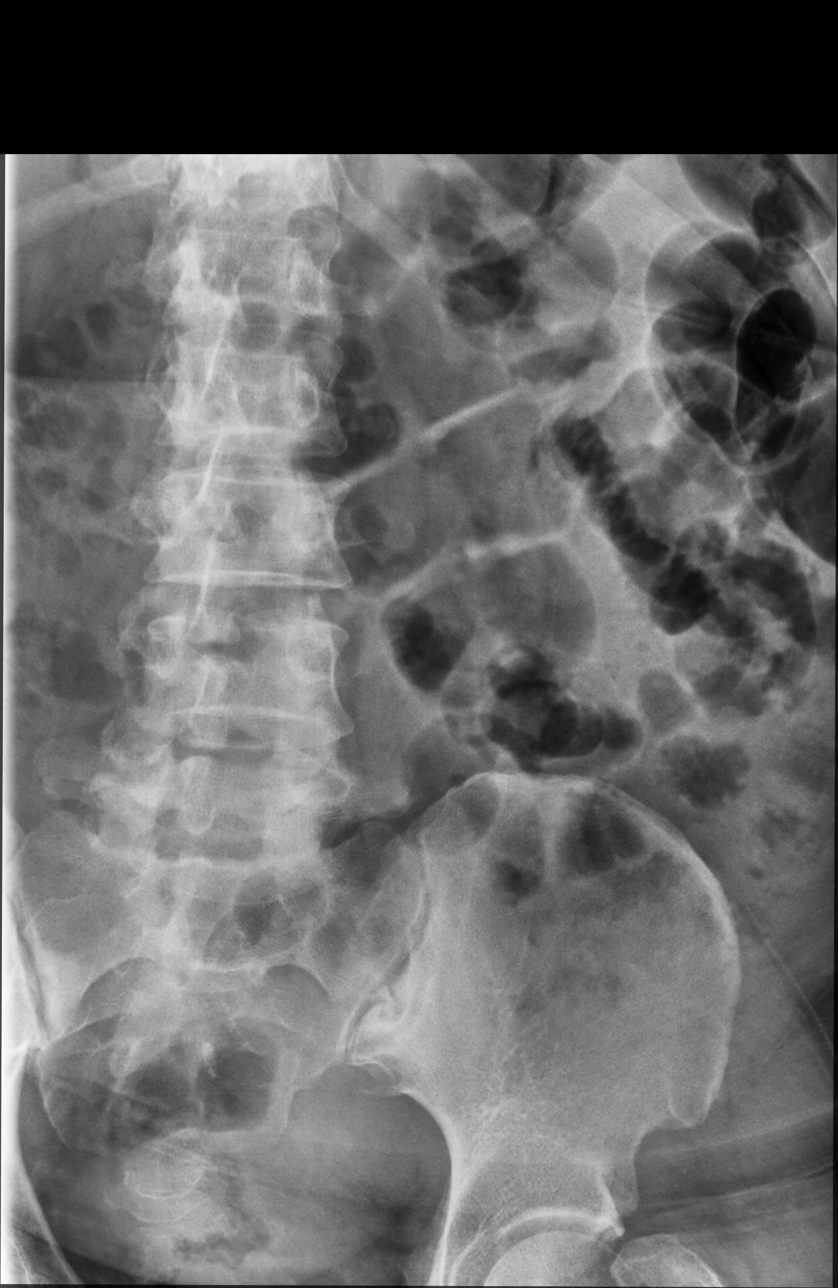

[lumbar spine lat]
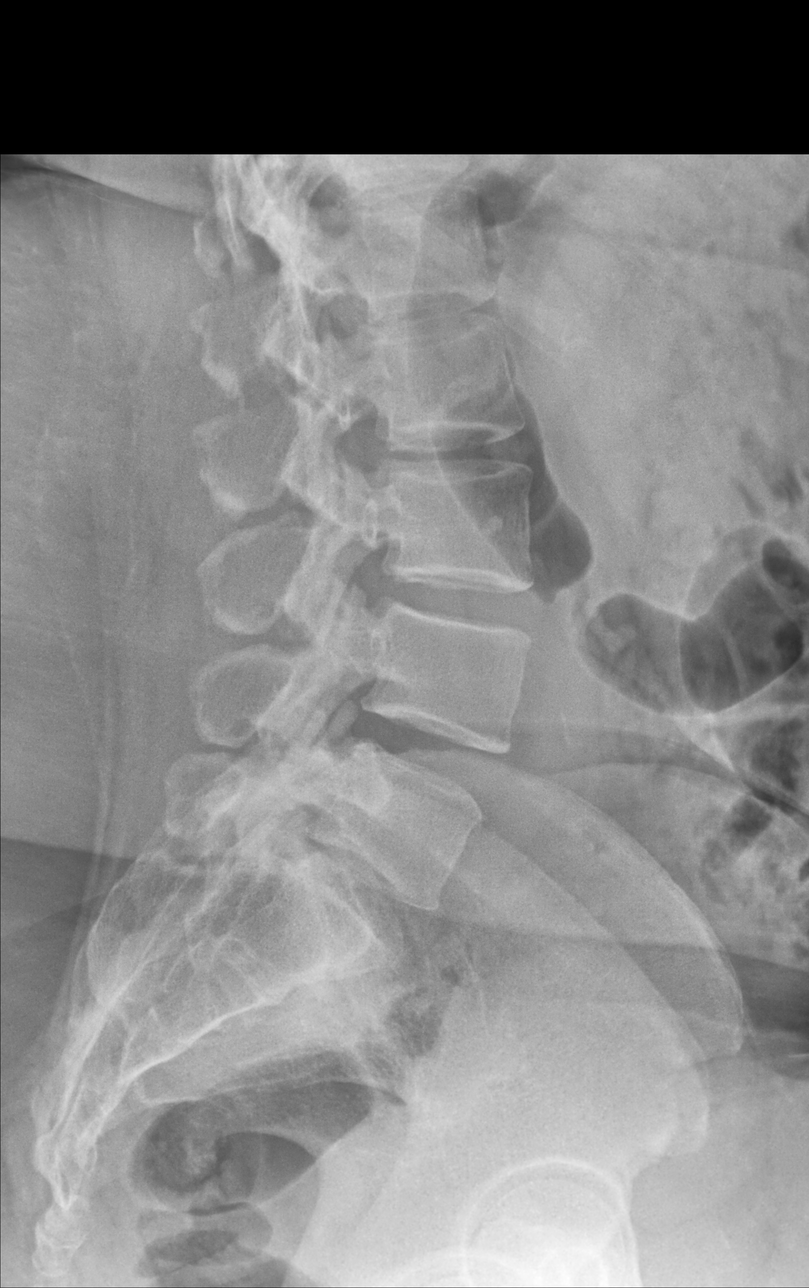

[lumbar spine lat l5s1]
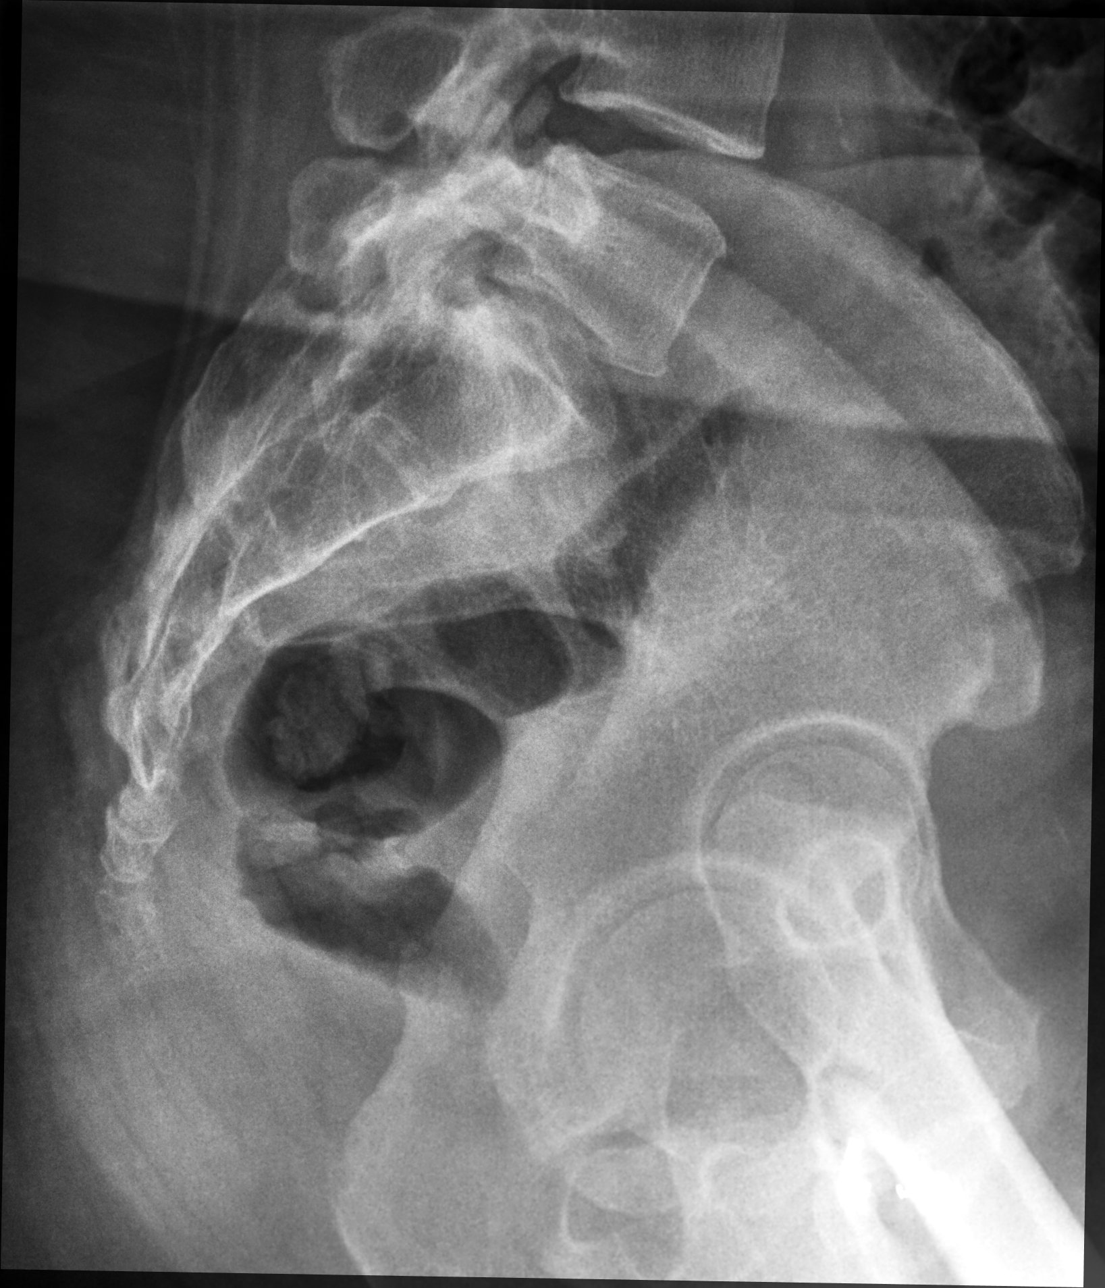

[5 of 5 positions shown; findings below may reference images not displayed]

FINDINGS: Alignment is anatomic. Vertebral body and disc space height are
maintained. No degenerative changes. No definite pars defects.
IMPRESSION: No acute findings.

## 2022-11-28 ENCOUNTER — Ambulatory Visit: Payer: Medicaid Other | Admitting: Nurse Practitioner

## 2022-11-28 VITALS — BP 124/83 | HR 65 | Ht 61.0 in | Wt 259.0 lb

## 2022-11-28 DIAGNOSIS — Z13220 Encounter for screening for lipoid disorders: Secondary | ICD-10-CM | POA: Diagnosis not present

## 2022-11-28 DIAGNOSIS — E7849 Other hyperlipidemia: Secondary | ICD-10-CM | POA: Diagnosis not present

## 2022-11-28 DIAGNOSIS — I10 Essential (primary) hypertension: Secondary | ICD-10-CM | POA: Diagnosis not present

## 2022-11-28 MED ORDER — METOPROLOL TARTRATE 25 MG PO TABS
25.0000 mg | ORAL_TABLET | Freq: Every day | ORAL | 0 refills | Status: DC
Start: 2022-11-28 — End: 2023-03-04

## 2022-11-28 MED ORDER — SPIRONOLACTONE-HCTZ 25-25 MG PO TABS
1.0000 | ORAL_TABLET | Freq: Every day | ORAL | 5 refills | Status: DC
Start: 2022-11-28 — End: 2023-04-09

## 2022-11-28 MED ORDER — CETIRIZINE HCL 10 MG PO TABS: 10.0000 mg | ORAL_TABLET | Freq: Every day | ORAL | 0 refills | Status: DC

## 2022-11-28 MED ORDER — ATORVASTATIN CALCIUM 20 MG PO TABS
40.0000 mg | ORAL_TABLET | Freq: Every day | ORAL | 2 refills | Status: DC
Start: 2022-11-28 — End: 2023-03-02

## 2022-11-28 MED ORDER — FERROUS SULFATE 325 (65 FE) MG PO TABS: 325.0000 mg | ORAL_TABLET | Freq: Every day | ORAL | 2 refills | Status: DC

## 2022-11-28 NOTE — Patient Instructions (Signed)
1. Primary hypertension  - spironolactone-hydrochlorothiazide (ALDACTAZIDE) 25-25 MG tablet; Take 1 tablet by mouth daily.  Dispense: 30 tablet; Refill: 5 - metoprolol tartrate (LOPRESSOR) 25 MG tablet; Take 1 tablet (25 mg total) by mouth daily.  Dispense: 90 tablet; Refill: 0 - CBC - Comprehensive metabolic panel  2. Other hyperlipidemia  - atorvastatin (LIPITOR) 20 MG tablet; Take 2 tablets (40 mg total) by mouth daily.  Dispense: 60 tablet; Refill: 2  3. Lipid screening  - Lipid Panel  Follow up:  Follow up in 6 months

## 2022-11-28 NOTE — Progress Notes (Signed)
Subjective   Patient ID: Miranda Charles, female    DOB: 1975/10/18, 47 y.o.   MRN: [id number]  Chief Complaint  Patient presents with   Annual Exam    Referring provider: Ivonne Andrew, NP  Havan Brooner is a 47 y.o. female with Past Medical History: No date: Anemia No date: High cholesterol No date: HTN (hypertension)   HPI  Patient presents today for physical and paperwork.  Patient is applying for a new job in [employer] through [employer] system.  They are requiring paperwork to be filled out with physical.  Overall patient is doing well.  She did pass vision and hearing test in office today.  She did recently have a negative TB test through [facility].  Patient does need refills on medications for hypertension and hyperlipidemia.  Denies f/c/s, n/v/d, hemoptysis, PND, leg swelling Denies chest pain or edema    Allergies  Allergen Reactions   Onion Anaphylaxis and Swelling    "throat closes"     Immunization History  Administered Date(s) Administered   Influenza,inj,Quad PF,6+ Mos 04/03/2020   Unspecified SARS-COV-2 Vaccination 08/31/2019    Tobacco History: Social History   Tobacco Use  Smoking Status Some Days   Current packs/day: 0.50   Types: Cigarettes  Smokeless Tobacco Never  Tobacco Comments   1 pack every 3 to 4 days   Ready to quit: Not Answered Counseling given: Not Answered Tobacco comments: 1 pack every 3 to 4 days   Outpatient Encounter Medications as of 11/28/2022  Medication Sig   Cholecalciferol 125 MCG (5000 UT) TABS Take by mouth.   tiZANidine (ZANAFLEX) 4 MG tablet Take 1 tablet (4 mg total) by mouth every 8 (eight) hours as needed for muscle spasms. Do not take with alcohol or while driving or operating heavy machinery.  May cause drowsiness.   [DISCONTINUED] cetirizine (ZYRTEC) 10 MG tablet Take 1 tablet (10 mg total) by mouth daily.   [DISCONTINUED] ferrous sulfate 325 (65 FE) MG tablet Take 1 tablet (325 mg total) by  mouth daily with breakfast.   [DISCONTINUED] metoprolol tartrate (LOPRESSOR) 25 MG tablet Take 1 tablet (25 mg total) by mouth daily.   atorvastatin (LIPITOR) 20 MG tablet Take 2 tablets (40 mg total) by mouth daily.   cetirizine (ZYRTEC) 10 MG tablet Take 1 tablet (10 mg total) by mouth daily.   ferrous sulfate 325 (65 FE) MG tablet Take 1 tablet (325 mg total) by mouth daily with breakfast.   metoprolol tartrate (LOPRESSOR) 25 MG tablet Take 1 tablet (25 mg total) by mouth daily.   spironolactone-hydrochlorothiazide (ALDACTAZIDE) 25-25 MG tablet Take 1 tablet by mouth daily.   [DISCONTINUED] atorvastatin (LIPITOR) 20 MG tablet Take 2 tablets (40 mg total) by mouth daily.   [DISCONTINUED] spironolactone-hydrochlorothiazide (ALDACTAZIDE) 25-25 MG tablet Take 1 tablet by mouth daily.   No facility-administered encounter medications on file as of 11/28/2022.    Review of Systems  Review of Systems  Constitutional: Negative.   HENT: Negative.    Cardiovascular: Negative.   Gastrointestinal: Negative.   Allergic/Immunologic: Negative.   Neurological: Negative.   Psychiatric/Behavioral: Negative.       Objective:   BP 124/83 (BP Location: Left Arm, Patient Position: Sitting, Cuff Size: Large)   Pulse 65   Ht 5\' 1"  (1.549 m)   Wt 259 lb (117.5 kg)   SpO2 99%   BMI 48.94 kg/m   Wt Readings from Last 5 Encounters:  11/28/22 259 lb (117.5 kg)  03/28/22  268 lb (121.6 kg)  11/07/21 264 lb 12.8 oz (120.1 kg)  05/03/21 276 lb (125.2 kg)  04/03/21 258 lb 3.2 oz (117.1 kg)     Physical Exam Vitals and nursing note reviewed.  Constitutional:      General: She is not in acute distress.    Appearance: She is well-developed.  Cardiovascular:     Rate and Rhythm: Normal rate and regular rhythm.  Pulmonary:     Effort: Pulmonary effort is normal.     Breath sounds: Normal breath sounds.  Neurological:     Mental Status: She is alert and oriented to person, place, and time.        Assessment & Plan:   Lipid screening -     Lipid panel  Primary hypertension -     Spironolactone-HCTZ; Take 1 tablet by mouth daily.  Dispense: 30 tablet; Refill: 5 -     Metoprolol Tartrate; Take 1 tablet (25 mg total) by mouth daily.  Dispense: 90 tablet; Refill: 0 -     CBC -     Comprehensive metabolic panel  Other hyperlipidemia -     Atorvastatin Calcium; Take 2 tablets (40 mg total) by mouth daily.  Dispense: 60 tablet; Refill: 2  Other orders -     Cetirizine HCl; Take 1 tablet (10 mg total) by mouth daily.  Dispense: 90 tablet; Refill: 0 -     Ferrous Sulfate; Take 1 tablet (325 mg total) by mouth daily with breakfast.  Dispense: 90 tablet; Refill: 2     Return in about 6 months (around 05/28/2023).   Ivonne Andrew, NP 11/28/2022

## 2022-11-29 LAB — LIPID PANEL
Chol/HDL Ratio: 3.3 ratio (ref 0.0–4.4)
Cholesterol, Total: 148 mg/dL (ref 100–199)
HDL: 45 mg/dL (ref >39)
LDL Chol Calc (NIH): 91 mg/dL (ref 0–99)
Triglycerides: 60 mg/dL (ref 0–149)
VLDL Cholesterol Cal: 12 mg/dL (ref 5–40)

## 2022-11-29 LAB — CBC
Hematocrit: 36.9 % (ref 34.0–46.6)
Hemoglobin: 11.8 g/dL (ref 11.1–15.9)
MCH: 32.2 pg (ref 26.6–33.0)
MCHC: 32 g/dL (ref 31.5–35.7)
MCV: 101 fL — ABNORMAL HIGH (ref 79–97)
Platelets: 296 10*3/uL (ref 150–450)
RBC: 3.67 x10E6/uL — ABNORMAL LOW (ref 3.77–5.28)
RDW: 12.4 % (ref 11.7–15.4)
WBC: 8.2 10*3/uL (ref 3.4–10.8)

## 2022-11-29 LAB — COMPREHENSIVE METABOLIC PANEL
ALT: 18 IU/L (ref 0–32)
AST: 18 IU/L (ref 0–40)
Albumin: 4.5 g/dL (ref 3.9–4.9)
Alkaline Phosphatase: 71 IU/L (ref 44–121)
BUN/Creatinine Ratio: 16 (ref 9–23)
BUN: 18 mg/dL (ref 6–24)
Bilirubin Total: 0.6 mg/dL (ref 0.0–1.2)
CO2: 22 mmol/L (ref 20–29)
Calcium: 9.7 mg/dL (ref 8.7–10.2)
Chloride: 102 mmol/L (ref 96–106)
Creatinine, Ser: 1.16 mg/dL — ABNORMAL HIGH (ref 0.57–1.00)
Globulin, Total: 2.8 g/dL (ref 1.5–4.5)
Glucose: 99 mg/dL (ref 70–99)
Potassium: 4.7 mmol/L (ref 3.5–5.2)
Sodium: 140 mmol/L (ref 134–144)
Total Protein: 7.3 g/dL (ref 6.0–8.5)
eGFR: 59 mL/min/{1.73_m2} — ABNORMAL LOW (ref >59)

## 2023-02-20 NOTE — Progress Notes (Signed)
Office Visit Note  Patient: Miranda Charles             Date of Birth: [birth year]           MRN: [id number]             PCP: Ivonne Andrew, NP Referring: Ivonne Andrew, NP Visit Date: 03/06/2023 Occupation: @GUAROCC @  Subjective:  Pain in multiple joints  History of Present Illness: Miranda Charles is a 47 y.o. female seen in consultation per request of her PCP for the evaluation of possible rheumatoid arthritis.  Patient states in 2015 she started having pain in her knees, hips and her hands.  She states her hands were swelling.  She was referred to a rheumatologist in [facility] who did x-rays and labs and diagnosed her with rheumatoid arthritis.  She was started on an oral medication.  Patient does not recall the name of the medicine.  Patient states she stayed on the medicine for about 40 years which worked for her but then she stopped taking the medicine by herself.  She lost follow-up with the rheumatologist.  Her symptoms gradually has been getting worse.  She was also seeing neurosurgery in [facility] for lower back pain and was getting steroid injections.  She moved to [facility] in July 2021.  She works as a Production assistant, radio at the [employer] and also does work as a Marketing executive at [employer]  She states over the last few years she has been having progressive discomfort in multiple joints.  She describes discomfort in her lower back, shoulders, elbows, wrist, hands, hips, knees, ankles and her feet.  She notices swelling in her hands.  She occasionally takes ibuprofen for pain.  She states that she has dry eyes and has seen ophthalmologist in the past.  She denies any history of shortness of breath.  She denies any family history of rheumatoid arthritis.  She is gravida 3, para 3.  There is no history of preeclampsia or DVTs.    Activities of Daily Living:  Patient reports morning stiffness for 45 minutes.   Patient Reports nocturnal pain.  Difficulty  dressing/grooming: Denies Difficulty climbing stairs: Denies Difficulty getting out of chair: Denies Difficulty using hands for taps, buttons, cutlery, and/or writing: Reports  Review of Systems  Constitutional:  Positive for fatigue.  HENT:  Negative for mouth sores and mouth dryness.   Eyes:  Positive for dryness. Negative for pain.  Respiratory:  Negative for shortness of breath and wheezing.   Cardiovascular:  Negative for chest pain and palpitations.  Gastrointestinal:  Positive for blood in stool, constipation and diarrhea.  Endocrine: Negative for increased urination.  Genitourinary:  Negative for involuntary urination.  Musculoskeletal:  Positive for joint pain, joint pain, joint swelling, muscle weakness and morning stiffness. Negative for gait problem, myalgias, muscle tenderness and myalgias.  Skin:  Negative for color change, rash, hair loss and sensitivity to sunlight.  Allergic/Immunologic: Negative for susceptible to infections.  Neurological:  Positive for dizziness and headaches. Negative for numbness.  Hematological:  Negative for swollen glands.  Psychiatric/Behavioral:  Negative for depressed mood and sleep disturbance. The patient is not nervous/anxious.     PMFS History:  Patient Active Problem List   Diagnosis Date Noted   Right foot pain 03/31/2022   Primary hypertension 11/07/2021   Class 3 severe obesity due to excess calories with serious comorbidity and body mass index (BMI) of 50.0 to 59.9 in adult [facility]) 04/03/2020  Nicotine dependence, cigarettes, uncomplicated 04/03/2020   Obstructive sleep apnea 04/03/2020   Lumbosacral radiculopathy at S1 07/27/2018   Meralgia paresthetica of left side 07/27/2018    Past Medical History:  Diagnosis Date   Anemia    High cholesterol    HTN (hypertension)     Family History  Problem Relation Age of Onset   Hypertension Mother    Healthy Son    Healthy Daughter    Hypertension Daughter    Anemia Daughter     Healthy Daughter    History reviewed. No pertinent surgical history. Social History   Social History Narrative   Not on file   Immunization History  Administered Date(s) Administered   Influenza,inj,Quad PF,6+ Mos 04/03/2020   PPD Test 11/11/2022   Unspecified SARS-COV-2 Vaccination 08/31/2019     Objective: Vital Signs: BP 112/79 (BP Location: Right Arm, Patient Position: Sitting, Cuff Size: Large)   Pulse 87   Resp 15   Ht 5' 2.5" (1.588 m)   Wt 253 lb 12.8 oz (115.1 kg)   LMP 02/26/2023 (Approximate)   BMI 45.68 kg/m    Physical Exam Vitals and nursing note reviewed.  Constitutional:      Appearance: She is well-developed.  HENT:     Head: Normocephalic and atraumatic.  Eyes:     Conjunctiva/sclera: Conjunctivae normal.  Cardiovascular:     Rate and Rhythm: Normal rate and regular rhythm.     Heart sounds: Normal heart sounds.  Pulmonary:     Effort: Pulmonary effort is normal.     Breath sounds: Normal breath sounds.  Abdominal:     General: Bowel sounds are normal.     Palpations: Abdomen is soft.  Musculoskeletal:     Cervical back: Normal range of motion.  Lymphadenopathy:     Cervical: No cervical adenopathy.  Skin:    General: Skin is warm and dry.     Capillary Refill: Capillary refill takes less than 2 seconds.  Neurological:     Mental Status: She is alert and oriented to person, place, and time.  Psychiatric:        Behavior: Behavior normal.      Musculoskeletal Exam: Cervical spine was in good range of motion.  She had limited range of motion of her lumbar spine.  Range of motion was difficult to assess due to body habitus.  Shoulders, elbows, wrists, MCPs PIPs and DIPs with good range of motion with no synovitis.  Hip joints and knee joints with good range of motion.  No warmth swelling or effusion was noted.  There was no tenderness over ankles or MTPs.  No synovitis was noted.  CDAI Exam: CDAI Score: -- Patient Global: 50 / 100; Provider  Global: -- Swollen: --; Tender: -- Joint Exam 03/06/2023   No joint exam has been documented for this visit   There is currently no information documented on the homunculus. Go to the Rheumatology activity and complete the homunculus joint exam.  Investigation: No additional findings.  Imaging: No results found.  Recent Labs: Lab Results  Component Value Date   WBC 8.2 11/28/2022   HGB 11.8 11/28/2022   PLT 296 11/28/2022   NA 140 11/28/2022   K 4.7 11/28/2022   CL 102 11/28/2022   CO2 22 11/28/2022   GLUCOSE 99 11/28/2022   BUN 18 11/28/2022   CREATININE 1.16 (H) 11/28/2022   BILITOT 0.6 11/28/2022   ALKPHOS 71 11/28/2022   AST 18 11/28/2022   ALT 18 11/28/2022  PROT 7.3 11/28/2022   ALBUMIN 4.5 11/28/2022   CALCIUM 9.7 11/28/2022    Speciality Comments: No specialty comments available.  Procedures:  No procedures performed Allergies: Onion   Assessment / Plan:     Visit Diagnoses: Rheumatoid factor positive - 03/28/22: ANA+, RF 146.3, and CRP 17.  Patient was diagnosed with rheumatoid arthritis in 2015 by rheumatologist in [facility].  Patient recalls being on an oral agent for about 40 years.  She does not recall the name of the medication.  She states her symptoms improved on the medication but she stopped the medication after 4 years and lost to follow-up.  She states at the time she presented with pain and swelling in her hands, discomfort in her hips and her knee joints.  Positive ANA (antinuclear antibody) - 03/28/22: ANA+, RF 146.3, and CRP 17 -she has positive ANA and she gives history of dry eyes.  I will obtain additional labs today.  She denies any history of oral ulcers, nasal ulcers, malar rash, photosensitivity, Raynaud's or lymphadenopathy.  Plan: ANA, Anti-scleroderma antibody, RNP Antibody, Anti-Smith antibody, Sjogrens syndrome-A extractable nuclear antibody, Anti-DNA antibody, double-stranded, Sjogrens syndrome-B extractable nuclear  antibody, C3 and C4  Pain in both hands -patient complains of intermittent discomfort in her hands and swelling.  No synovitis was noted on the examination today.  Plan: XR Hand 2 View Right, XR Hand 2 View Left, x-rays of bilateral hands were unremarkable.  Sedimentation rate, Cyclic citrul peptide antibody, IgG, Rheumatoid factor  Chronic pain of both knees -she complain of pain and discomfort in the bilateral knee joints.  No warmth swelling or effusion was noted.  Plan: XR KNEE 3 VIEW RIGHT, XR KNEE 3 VIEW LEFT.  X-rays of bilateral knee joints were unremarkable.  Chronic pain of both ankles -she has intermittent discomfort in her feet and her right ankle.  No synovitis was noted.   Pain in both feet-patient had no synovitis on the examination.  Plan: XR Foot 2 Views Left, XR Foot 2 Views Right.  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  Dorsal spurring was noted.  Significant arthritis was noted in the right midfoot.  Other fatigue -patient gives history of increased fatigue.  Plan: CBC with Differential/Platelet, COMPLETE METABOLIC PANEL WITH GFR, CK, TSH, Hepatitis B core antibody, IgM, Hepatitis B surface antigen, Hepatitis C antibody, Glucose 6 phosphate dehydrogenase  Meralgia paresthetica of left side  Lumbosacral radiculopathy at S1  Primary hypertension-blood was normal today.  Dyslipidemia  Obstructive sleep apnea - not using CPAP as she finds it uncomfortable.  Class 3 severe obesity due to excess calories with serious comorbidity and body mass index (BMI) of 50.0 to 59.9 in adult (HCC)  Smoker - 1/3 PPD x 10 years  Orders: Orders Placed This Encounter  Procedures   XR Hand 2 View Right   XR Hand 2 View Left   XR KNEE 3 VIEW RIGHT   XR KNEE 3 VIEW LEFT   XR Foot 2 Views Left   XR Foot 2 Views Right   CBC with Differential/Platelet   COMPLETE METABOLIC PANEL WITH GFR   Sedimentation rate   CK   TSH   Cyclic citrul peptide antibody, IgG   ANA    Anti-scleroderma antibody   RNP Antibody   Anti-Smith antibody   Sjogrens syndrome-A extractable nuclear antibody   Anti-DNA antibody, double-stranded   Sjogrens syndrome-B extractable nuclear antibody   C3 and C4   Rheumatoid factor   Hepatitis B core antibody,  IgM   Hepatitis B surface antigen   Hepatitis C antibody   Glucose 6 phosphate dehydrogenase   No orders of the defined types were placed in this encounter.    Follow-Up Instructions: Return for Rheumatoid arthritis.   Pollyann Savoy, MD  Note - This record has been created using Animal nutritionist.  Chart creation errors have been sought, but may not always  have been located. Such creation errors do not reflect on  the standard of medical care.

## 2023-03-02 ENCOUNTER — Ambulatory Visit
Admission: RE | Admit: 2023-03-02 | Discharge: 2023-03-02 | Disposition: A | Discharge status: Home / Self Care | Payer: Medicaid Other | Source: Ambulatory Visit

## 2023-03-02 ENCOUNTER — Other Ambulatory Visit: Payer: Self-pay

## 2023-03-02 ENCOUNTER — Other Ambulatory Visit: Payer: Self-pay | Admitting: Nurse Practitioner

## 2023-03-02 VITALS — BP 120/87 | HR 92 | Temp 98.3°F | Resp 18

## 2023-03-02 DIAGNOSIS — J069 Acute upper respiratory infection, unspecified: Secondary | ICD-10-CM | POA: Diagnosis not present

## 2023-03-02 DIAGNOSIS — E7849 Other hyperlipidemia: Secondary | ICD-10-CM

## 2023-03-02 LAB — POC COVID19/FLU A&B COMBO
Covid Antigen, POC: NEGATIVE
Influenza A Antigen, POC: NEGATIVE
Influenza B Antigen, POC: NEGATIVE

## 2023-03-02 LAB — POCT RAPID STREP A (OFFICE): Rapid Strep A Screen: NEGATIVE

## 2023-03-02 NOTE — ED Triage Notes (Signed)
Pt reports sore throat, body aches, productive cough w/ clear sputum, and nasal congestion x4 days. Little relief with dayquil/nyquil at home. Pt reports fevers at home. Does not know max temp.

## 2023-03-04 ENCOUNTER — Other Ambulatory Visit: Payer: Self-pay | Admitting: Nurse Practitioner

## 2023-03-04 DIAGNOSIS — I10 Essential (primary) hypertension: Secondary | ICD-10-CM

## 2023-03-06 ENCOUNTER — Ambulatory Visit: Payer: Medicaid Other

## 2023-03-06 ENCOUNTER — Ambulatory Visit: Payer: Medicaid Other | Attending: Rheumatology | Admitting: Rheumatology

## 2023-03-06 ENCOUNTER — Encounter: Payer: Self-pay | Admitting: Rheumatology

## 2023-03-06 VITALS — BP 112/79 | HR 87 | Resp 15 | Ht 62.5 in | Wt 253.8 lb

## 2023-03-06 DIAGNOSIS — M79641 Pain in right hand: Secondary | ICD-10-CM

## 2023-03-06 DIAGNOSIS — F17200 Nicotine dependence, unspecified, uncomplicated: Secondary | ICD-10-CM

## 2023-03-06 DIAGNOSIS — M79642 Pain in left hand: Secondary | ICD-10-CM

## 2023-03-06 DIAGNOSIS — R768 Other specified abnormal immunological findings in serum: Secondary | ICD-10-CM

## 2023-03-06 DIAGNOSIS — M79671 Pain in right foot: Secondary | ICD-10-CM

## 2023-03-06 DIAGNOSIS — M25562 Pain in left knee: Secondary | ICD-10-CM

## 2023-03-06 DIAGNOSIS — R5383 Other fatigue: Secondary | ICD-10-CM

## 2023-03-06 DIAGNOSIS — G8929 Other chronic pain: Secondary | ICD-10-CM

## 2023-03-06 DIAGNOSIS — M25571 Pain in right ankle and joints of right foot: Secondary | ICD-10-CM

## 2023-03-06 DIAGNOSIS — G4733 Obstructive sleep apnea (adult) (pediatric): Secondary | ICD-10-CM

## 2023-03-06 DIAGNOSIS — M25572 Pain in left ankle and joints of left foot: Secondary | ICD-10-CM

## 2023-03-06 DIAGNOSIS — M25561 Pain in right knee: Secondary | ICD-10-CM

## 2023-03-06 DIAGNOSIS — M0580 Other rheumatoid arthritis with rheumatoid factor of unspecified site: Secondary | ICD-10-CM

## 2023-03-06 DIAGNOSIS — E785 Hyperlipidemia, unspecified: Secondary | ICD-10-CM

## 2023-03-06 DIAGNOSIS — I10 Essential (primary) hypertension: Secondary | ICD-10-CM

## 2023-03-06 DIAGNOSIS — Z6841 Body Mass Index (BMI) 40.0 and over, adult: Secondary | ICD-10-CM

## 2023-03-06 DIAGNOSIS — G5712 Meralgia paresthetica, left lower limb: Secondary | ICD-10-CM

## 2023-03-06 DIAGNOSIS — M5417 Radiculopathy, lumbosacral region: Secondary | ICD-10-CM

## 2023-03-06 DIAGNOSIS — M2550 Pain in unspecified joint: Secondary | ICD-10-CM

## 2023-03-06 DIAGNOSIS — F17210 Nicotine dependence, cigarettes, uncomplicated: Secondary | ICD-10-CM

## 2023-03-06 DIAGNOSIS — E66813 Obesity, class 3: Secondary | ICD-10-CM

## 2023-03-06 DIAGNOSIS — M79672 Pain in left foot: Secondary | ICD-10-CM

## 2023-03-08 NOTE — Progress Notes (Signed)
Sed rate is elevated at 80, RF remains positive, ANA positive at low titer, SSA positive, RNP negative, SCL 70 negative, Smith negative, dsDNA negative, SSA negative, C3-C4 normal, CBC normal, CMP normal, CK normal, TSH normal, hepatitis B nonreactive, hepatitis C nonreactive,Anti-CCP pending, G6PD pending.  I will discuss results at the follow-up visit.

## 2023-03-10 LAB — COMPLETE METABOLIC PANEL WITH GFR
AG Ratio: 1.5 (calc) (ref 1.0–2.5)
ALT: 17 U/L (ref 6–29)
AST: 13 U/L (ref 10–35)
Albumin: 4.5 g/dL (ref 3.6–5.1)
Alkaline phosphatase (APISO): 68 U/L (ref 31–125)
BUN: 13 mg/dL (ref 7–25)
CO2: 30 mmol/L (ref 20–32)
Calcium: 9.4 mg/dL (ref 8.6–10.2)
Chloride: 101 mmol/L (ref 98–110)
Creat: 0.97 mg/dL (ref 0.50–0.99)
Globulin: 3 g/dL (ref 1.9–3.7)
Glucose, Bld: 85 mg/dL (ref 65–99)
Potassium: 4.3 mmol/L (ref 3.5–5.3)
Sodium: 140 mmol/L (ref 135–146)
Total Bilirubin: 0.6 mg/dL (ref 0.2–1.2)
Total Protein: 7.5 g/dL (ref 6.1–8.1)
eGFR: 73 mL/min/{1.73_m2} (ref >=60)

## 2023-03-10 LAB — ANTI-NUCLEAR AB-TITER (ANA TITER)
ANA TITER: 1:40 {titer} — ABNORMAL HIGH
ANA Titer 1: 1:40 {titer} — ABNORMAL HIGH

## 2023-03-10 LAB — ANA: Anti Nuclear Antibody (ANA): POSITIVE — AB

## 2023-03-10 LAB — CBC WITH DIFFERENTIAL/PLATELET
Absolute Lymphocytes: 2451 {cells}/uL (ref 850–3900)
Absolute Monocytes: 319 {cells}/uL (ref 200–950)
Basophils Absolute: 39 {cells}/uL (ref 0–200)
Basophils Relative: 0.6 %
Eosinophils Absolute: 169 {cells}/uL (ref 15–500)
Eosinophils Relative: 2.6 %
HCT: 39 % (ref 35.0–45.0)
Hemoglobin: 12.7 g/dL (ref 11.7–15.5)
MCH: 32 pg (ref 27.0–33.0)
MCHC: 32.6 g/dL (ref 32.0–36.0)
MCV: 98.2 fL (ref 80.0–100.0)
MPV: 10.8 fL (ref 7.5–12.5)
Monocytes Relative: 4.9 %
Neutro Abs: 3523 {cells}/uL (ref 1500–7800)
Neutrophils Relative %: 54.2 %
Platelets: 295 10*3/uL (ref 140–400)
RBC: 3.97 10*6/uL (ref 3.80–5.10)
RDW: 11.4 % (ref 11.0–15.0)
Total Lymphocyte: 37.7 %
WBC: 6.5 10*3/uL (ref 3.8–10.8)

## 2023-03-10 LAB — RNP ANTIBODY: Ribonucleic Protein(ENA) Antibody, IgG: <1 AI

## 2023-03-10 LAB — CK: Total CK: 78 U/L (ref 29–143)

## 2023-03-10 LAB — TSH: TSH: 2.96 m[IU]/L

## 2023-03-10 LAB — C3 AND C4
C3 Complement: 184 mg/dL (ref 83–193)
C4 Complement: 42 mg/dL (ref 15–57)

## 2023-03-10 LAB — ANTI-SMITH ANTIBODY: ENA SM Ab Ser-aCnc: <1 AI

## 2023-03-10 LAB — SEDIMENTATION RATE: Sed Rate: 80 mm/h — ABNORMAL HIGH (ref 0–20)

## 2023-03-10 LAB — SJOGRENS SYNDROME-A EXTRACTABLE NUCLEAR ANTIBODY: SSA (Ro) (ENA) Antibody, IgG: 6.2 AI — AB

## 2023-03-10 LAB — HEPATITIS B SURFACE ANTIGEN: Hepatitis B Surface Ag: NONREACTIVE

## 2023-03-10 LAB — ANTI-DNA ANTIBODY, DOUBLE-STRANDED: ds DNA Ab: 1 [IU]/mL

## 2023-03-10 LAB — RHEUMATOID FACTOR: Rheumatoid fact SerPl-aCnc: 157 [IU]/mL — ABNORMAL HIGH (ref <14)

## 2023-03-10 LAB — CYCLIC CITRUL PEPTIDE ANTIBODY, IGG: Cyclic Citrullin Peptide Ab: >250 U — ABNORMAL HIGH

## 2023-03-10 LAB — HEPATITIS B CORE ANTIBODY, IGM: Hep B C IgM: NONREACTIVE

## 2023-03-10 LAB — HEPATITIS C ANTIBODY: Hepatitis C Ab: NONREACTIVE

## 2023-03-10 LAB — SJOGRENS SYNDROME-B EXTRACTABLE NUCLEAR ANTIBODY: SSB (La) (ENA) Antibody, IgG: <1 AI

## 2023-03-10 LAB — ANTI-SCLERODERMA ANTIBODY: Scleroderma (Scl-70) (ENA) Antibody, IgG: <1 AI

## 2023-03-10 LAB — GLUCOSE 6 PHOSPHATE DEHYDROGENASE: G-6PDH: 14.2 U/g{Hb} (ref 7.0–20.5)

## 2023-03-11 NOTE — Progress Notes (Signed)
G6PD normal, anti-CCP positive which can be seen in rheumatoid arthritis.  Please schedule an earlier appointment if available.

## 2023-03-12 ENCOUNTER — Telehealth: Payer: Self-pay | Admitting: *Deleted

## 2023-03-12 NOTE — Telephone Encounter (Signed)
Attempted to contact patient and left message to advise patient to call the office and schedule NPT FU appointment sooner.

## 2023-03-12 NOTE — Telephone Encounter (Signed)
-----   Message from [facility] sent at 03/11/2023  6:56 PM EST ----- G6PD normal, anti-CCP positive which can be seen in rheumatoid arthritis.  Please schedule an earlier appointment if available.

## 2023-03-12 NOTE — Telephone Encounter (Signed)
Please keep an eye on the schedule and if there are any cancellations prior to this patient's schedule appointment, please schedule her sooner. Thanks!

## 2023-03-12 NOTE — ED Provider Notes (Signed)
[facility]    CSN: [id number] Arrival date & time: 03/02/23  1147      History   Chief Complaint Chief Complaint  Patient presents with   Sore Throat    Chills congestion fatigue cough headache - Entered by patient   Generalized Body Aches    HPI Miranda Charles is a 47 y.o. female.   Patient here today for evaluation of sore throat, body aches, productive cough and nasal congestion she has had for 4 days.  She reports a little improvement with DayQuil and NyQuil.  She has had subjective fevers at home.  She denies any vomiting or diarrhea.  The history is provided by the patient.  Sore Throat Pertinent negatives include no abdominal pain and no shortness of breath.    Past Medical History:  Diagnosis Date   Anemia    High cholesterol    HTN (hypertension)     Patient Active Problem List   Diagnosis Date Noted   Right foot pain 03/31/2022   Primary hypertension 11/07/2021   Class 3 severe obesity due to excess calories with serious comorbidity and body mass index (BMI) of 50.0 to 59.9 in adult (HCC) 04/03/2020   Nicotine dependence, cigarettes, uncomplicated 04/03/2020   Obstructive sleep apnea 04/03/2020   Lumbosacral radiculopathy at S1 07/27/2018   Meralgia paresthetica of left side 07/27/2018    History reviewed. No pertinent surgical history.  OB History   No obstetric history on file.      Home Medications    Prior to Admission medications   Medication Sig Start Date End Date Taking? Authorizing Provider  atorvastatin (LIPITOR) 20 MG tablet Take 20 mg by mouth daily.   Yes [provider]  cetirizine (ZYRTEC) 10 MG tablet Take 1 tablet (10 mg total) by mouth daily. 11/28/22  Yes Ivonne Andrew, NP  Cholecalciferol 125 MCG (5000 UT) TABS Take by mouth.   Yes [provider]  ferrous sulfate 325 (65 FE) MG tablet Take 1 tablet (325 mg total) by mouth daily with breakfast. 11/28/22  Yes Ivonne Andrew, NP   spironolactone-hydrochlorothiazide (ALDACTAZIDE) 25-25 MG tablet Take 1 tablet by mouth daily. 11/28/22 05/27/23 Yes Ivonne Andrew, NP  atorvastatin (LIPITOR) 20 MG tablet TAKE 2 TABLETS(40 MG) BY MOUTH DAILY Patient not taking: Reported on 03/02/2023 03/02/23   Ivonne Andrew, NP  Ferrous Fumarate (FERROCITE) 324 (106 Fe) MG TABS tablet Take 1 tablet by mouth as directed.  1 tablet Orally Three times a Week Patient not taking: Reported on 03/06/2023    [provider]  metoprolol succinate (TOPROL-XL) 25 MG 24 hr tablet Take 25 mg by mouth daily. Patient not taking: Reported on 03/06/2023    [provider]  metoprolol tartrate (LOPRESSOR) 25 MG tablet TAKE 1 TABLET(25 MG) BY MOUTH DAILY 03/04/23   Ivonne Andrew, NP  spironolactone-hydrochlorothiazide (ALDACTAZIDE) 25-25 MG tablet Take 1 tablet by mouth daily. Patient not taking: Reported on 03/06/2023    [provider]    Family History Family History  Problem Relation Age of Onset   Hypertension Mother    Healthy Son    Healthy Daughter    Hypertension Daughter    Anemia Daughter    Healthy Daughter     Social History Social History   Tobacco Use   Smoking status: Some Days    Current packs/day: 0.50    Types: Cigarettes    Passive exposure: Never   Smokeless tobacco: Never   Tobacco comments:  1 pack every 3 to 4 days  Vaping Use   Vaping status: Never Used  Substance Use Topics   Alcohol use: Yes    Comment: social drinker   Drug use: Not Currently     Allergies   Onion   Review of Systems Review of Systems  Constitutional:  Positive for chills and fever.  HENT:  Positive for congestion and sore throat. Negative for ear pain.   Eyes:  Negative for discharge and redness.  Respiratory:  Positive for cough. Negative for shortness of breath and wheezing.   Gastrointestinal:  Negative for abdominal pain, diarrhea, nausea and vomiting.     Physical Exam Triage Vital  Signs ED Triage Vitals  Encounter Vitals Group     BP 03/02/23 1214 120/87     Systolic BP Percentile --      Diastolic BP Percentile --      Pulse Rate 03/02/23 1214 92     Resp 03/02/23 1214 18     Temp 03/02/23 1214 98.3 F (36.8 C)     Temp Source 03/02/23 1214 Oral     SpO2 03/02/23 1214 98 %     Weight --      Height --      Head Circumference --      Peak Flow --      Pain Score 03/02/23 1215 7     Pain Loc --      Pain Education --      Exclude from Growth Chart --    No data found.  Updated Vital Signs BP 120/87 (BP Location: Right Arm)   Pulse 92   Temp 98.3 F (36.8 C) (Oral)   Resp 18   LMP 02/26/2023 (Approximate)   SpO2 98%   Visual Acuity Right Eye Distance:   Left Eye Distance:   Bilateral Distance:    Right Eye Near:   Left Eye Near:    Bilateral Near:     Physical Exam Vitals and nursing note reviewed.  Constitutional:      General: She is not in acute distress.    Appearance: Normal appearance. She is not ill-appearing.  HENT:     Head: Normocephalic and atraumatic.     Nose: Congestion present.     Mouth/Throat:     Mouth: Mucous membranes are moist.     Pharynx: No oropharyngeal exudate or posterior oropharyngeal erythema.  Eyes:     Conjunctiva/sclera: Conjunctivae normal.  Cardiovascular:     Rate and Rhythm: Normal rate and regular rhythm.     Heart sounds: Normal heart sounds. No murmur heard. Pulmonary:     Effort: Pulmonary effort is normal. No respiratory distress.     Breath sounds: Normal breath sounds. No wheezing, rhonchi or rales.  Skin:    General: Skin is warm and dry.  Neurological:     Mental Status: She is alert.  Psychiatric:        Mood and Affect: Mood normal.        Thought Content: Thought content normal.      UC Treatments / Results  Labs (all labs ordered are listed, but only abnormal results are displayed) Labs Reviewed  POCT RAPID STREP A (OFFICE) - Normal  POC COVID19/FLU A&B COMBO - Normal     EKG   Radiology No results found.  Procedures Procedures (including critical care time)  Medications Ordered in UC Medications - No data to display  Initial Impression / Assessment and Plan / UC Course  I have reviewed the triage vital signs and the nursing notes.  Pertinent labs & imaging results that were available during my care of the patient were reviewed by me and considered in my medical decision making (see chart for details).    Suspect viral etiology of symptoms.  Point-of-care flu/covid and strep screening negative.  Encouraged symptomatic treatment, increase fluids and rest.  Recommended follow-up if no gradual improvement with any further concerns.  Final Clinical Impressions(s) / UC Diagnoses   Final diagnoses:  Acute upper respiratory infection   Discharge Instructions   None    ED Prescriptions   None    PDMP not reviewed this encounter.   Tomi Bamberger, PA-C 03/12/23 [phone]

## 2023-03-12 NOTE — Progress Notes (Signed)
 Office Visit Note  Patient: Miranda Charles             Date of Birth: [birth year]           MRN: [id number]             PCP: Oley Miranda RAMAN, NP Referring: Oley Miranda RAMAN, NP Visit Date: 03/19/2023 Occupation: @GUAROCC @  Subjective:  Pain in multiple joints  History of Present Illness: Jyrah Blye is a 47 y.o. female with positive rheumatoid factor positive anti-CCP and elevated sedimentation rate.  She continues to have pain and discomfort in multiple joints.  She states the pain is mostly in her shoulders, both hands, both knees and her ankles and feet.  She notices stiffness in the morning in her hands mostly.  Lower back pain is manageable.    Activities of Daily Living:  Patient reports morning stiffness for 10-15 minutes.   Patient Denies nocturnal pain.  Difficulty dressing/grooming: Denies Difficulty climbing stairs: Denies Difficulty getting out of chair: Denies Difficulty using hands for taps, buttons, cutlery, and/or writing: Reports  Review of Systems  Constitutional:  Negative for fatigue.  HENT:  Negative for mouth sores and mouth dryness.   Eyes:  Negative for dryness.  Respiratory:  Negative for shortness of breath.   Cardiovascular:  Negative for chest pain and palpitations.  Gastrointestinal:  Negative for blood in stool, constipation and diarrhea.  Endocrine: Negative for increased urination.  Genitourinary:  Negative for involuntary urination.  Musculoskeletal:  Positive for joint pain, joint pain and morning stiffness. Negative for gait problem, joint swelling, myalgias, muscle weakness, muscle tenderness and myalgias.  Skin:  Negative for color change, rash, hair loss and sensitivity to sunlight.  Allergic/Immunologic: Negative for susceptible to infections.  Neurological:  Negative for dizziness and headaches.  Hematological:  Negative for swollen glands.  Psychiatric/Behavioral:  Negative for depressed mood and sleep disturbance. The patient is not  nervous/anxious.     PMFS History:  Patient Active Problem List   Diagnosis Date Noted   Right foot pain 03/31/2022   Primary hypertension 11/07/2021   Class 3 severe obesity due to excess calories with serious comorbidity and body mass index (BMI) of 50.0 to 59.9 in adult (HCC) 04/03/2020   Nicotine dependence, cigarettes, uncomplicated 04/03/2020   Obstructive sleep apnea 04/03/2020   Lumbosacral radiculopathy at S1 07/27/2018   Meralgia paresthetica of left side 07/27/2018    Past Medical History:  Diagnosis Date   Anemia    High cholesterol    HTN (hypertension)     Family History  Problem Relation Age of Onset   Hypertension Mother    Healthy Son    Healthy Daughter    Hypertension Daughter    Anemia Daughter    Healthy Daughter    History reviewed. No pertinent surgical history. Social History   Social History Narrative   Not on file   Immunization History  Administered Date(s) Administered   Influenza,inj,Quad PF,6+ Mos 04/03/2020   PPD Test 11/11/2022   Unspecified SARS-COV-2 Vaccination 08/31/2019     Objective: Vital Signs: BP (!) 151/91 (BP Location: Left Arm, Patient Position: Sitting, Cuff Size: Normal)   Pulse 88   Resp 14   Ht 5' (1.524 m)   Wt 259 lb (117.5 kg)   LMP 02/26/2023 (Approximate)   BMI 50.58 kg/m    Physical Exam Vitals and nursing note reviewed.  Constitutional:      Appearance: She is well-developed.  HENT:     Head: Normocephalic  and atraumatic.  Eyes:     Conjunctiva/sclera: Conjunctivae normal.  Cardiovascular:     Rate and Rhythm: Normal rate and regular rhythm.     Heart sounds: Normal heart sounds.  Pulmonary:     Effort: Pulmonary effort is normal.     Breath sounds: Normal breath sounds.  Abdominal:     General: Bowel sounds are normal.     Palpations: Abdomen is soft.  Musculoskeletal:     Cervical back: Normal range of motion.  Lymphadenopathy:     Cervical: No cervical adenopathy.  Skin:    General:  Skin is warm and dry.     Capillary Refill: Capillary refill takes less than 2 seconds.  Neurological:     Mental Status: She is alert and oriented to person, place, and time.  Psychiatric:        Behavior: Behavior normal.      Musculoskeletal Exam: Patient had a good range of motion of the cervical spine.  She had limited painful range of motion of her lumbar spine.  She had discomfort range of motion of her bilateral shoulders.  Elbow joints, wrist joints, MCPs PIPs and DIPs with good range of motion with no synovitis.  Hip joints and knee joints in good range of motion without any warmth swelling or effusion.  There was no tenderness over ankles or MTPs.  CDAI Exam: CDAI Score: -- Patient Global: --; Provider Global: -- Swollen: --; Tender: -- Joint Exam 03/19/2023   No joint exam has been documented for this visit   There is currently no information documented on the homunculus. Go to the Rheumatology activity and complete the homunculus joint exam.  Investigation: No additional findings.  Imaging: XR Foot 2 Views Left Result Date: 03/06/2023 First MTP, PIP and DIP narrowing was noted.  Dorsal spurring was noted.  No tibiotalar or subtalar joint space narrowing was noted.  Inferior and posterior calcaneal spurs were noted.  No erosive changes were noted. 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  XR Foot 2 Views Right Result Date: 03/06/2023 PIP and DIP narrowing is noted.  No MTP narrowing or erosive changes were noted.  Intertarsal narrowing and dorsal spurring was noted.  No tibiotalar or subtalar joint space narrowing was noted.  Inferior posterior calcaneal spurs were noted.  No erosive changes were noted. 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  XR KNEE 3 VIEW LEFT Result Date: 03/06/2023 No medial or lateral compartment narrowing was noted.  No patellofemoral narrowing was noted.  No chondrocalcinosis was  noted. Impression: Unremarkable x-rays of the knee.  XR KNEE 3 VIEW RIGHT Result Date: 03/06/2023 No medial or lateral compartment narrowing was noted.  No patellofemoral narrowing was noted.  No chondrocalcinosis was noted. Impression: Unremarkable x-rays of the knee.  XR Hand 2 View Left Result Date: 03/06/2023 No MCP, PIP or DIP narrowing was noted.  No CMC or intercarpal or radiocarpal joint space narrowing was noted.  No erosive changes were noted. Impression: Unremarkable x-rays of the hand.  XR Hand 2 View Right Result Date: 03/06/2023 No MCP, PIP or DIP narrowing was noted.  No CMC or intercarpal or radiocarpal joint space narrowing was noted.  No erosive changes were noted. Impression: Unremarkable x-rays of the hand.   Recent Labs: Lab Results  Component Value Date   WBC 6.5 03/06/2023   HGB 12.7 03/06/2023   PLT 295 03/06/2023   NA 140 03/06/2023   K 4.3 03/06/2023  CL 101 03/06/2023   CO2 30 03/06/2023   GLUCOSE 85 03/06/2023   BUN 13 03/06/2023   CREATININE 0.97 03/06/2023   BILITOT 0.6 03/06/2023   ALKPHOS 71 11/28/2022   AST 13 03/06/2023   ALT 17 03/06/2023   PROT 7.5 03/06/2023   ALBUMIN 4.5 11/28/2022   CALCIUM  9.4 03/06/2023   March 06, 2023 ANA 1: 40 NS, SSA 6.2, SSB negative, Smith negative, dsDNA negative RNP negative, SCL 70 negative, C3-C4 normal, RF 157, anti-CCP> 250, ESR 80, G6PD normal, hepatitis B nonreactive, hepatitis C nonreactive, TSH normal, CK normal Speciality Comments: No specialty comments available.  Procedures:  No procedures performed Allergies: Onion   Assessment / Plan:     Visit Diagnoses: Rheumatoid arthritis involving multiple sites with positive rheumatoid factor (HCC) - Positive RF, positive anti-CCP, positive ANA, positive SSA, elevated sedimentation rate, polyarthralgia:dxd with RA 2015 and ttdx 36yrs with unknown medication.  Patient had no synovitis on the examination although she continues to have significant morning  stiffness and joint pain.  No synovitis was noted on the examination.  She had painful range of motion of bilateral shoulders.  She denies sicca symptoms.  Detailed counseling regarding rheumatoid arthritis was provided.  A handout was given.  After informed consent was obtained and side effects were discussed we decided to proceed with hydroxychloroquine .  The plan is to start her on hydroxychloroquine  200 mg p.o. twice daily.  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  High risk medication use-baseline eye examination was advised and then annual eye examination was advised.  She was advised to get labs in a month, 3 months and then every 5 months.  Information on immunization was also placed in the AVS.  Chronic pain of both shoulders-she continues to have pain and discomfort in her bilateral shoulders.  She has difficulty raising her shoulders.  Right shoulder joint is more painful and causes nocturnal pain.  I discussed cortisone injection.  Her blood pressure was elevated today.  Patient stated that she did not take blood pressure medications this morning.  She will take blood pressure medications when she goes home.  If her blood pressure is normal at the next visit and her symptoms do not improve then we will consider cortisone injection.  Good dental hygiene and smoking cessation was discussed.  Pain in both hands - History of intermittent discomfort and swelling.  No synovitis noted.  She continues to have significant stiffness.  X-rays obtained at the last visit were unremarkable.  X-ray reviewed with the patient.  Chronic pain of both knees - No warmth swelling or effusion was noted.  X-rays obtained at the last visit were unremarkable.  Chronic pain of both ankles - History of intermittent discomfort and swelling.  No synovitis was noted.  Pain in both feet - History of intermittent pain.  No synovitis noted.  X-rays obtained at the last visit were  suggestive of osteoarthritis.  Significant arthritis was noted in the right midfoot.  Lumbosacral radiculopathy at S1-she continues to have chronic discomfort.  Meralgia paresthetica of left side  Dyslipidemia-she is on Lipitor.  Weight loss diet and exercise was discussed.  Primary hypertension-blood pressure was elevated at 151/91.  She was advised to take blood pressure medication on regular basis and follow-up with her PCP.  Obstructive sleep apnea - Patient is not using CPAP as she finds it uncomfortable.  Class 3 severe obesity due to excess calories with serious comorbidity and body mass  index (BMI) of 50.0 to 59.9 in adult (HCC)-weight loss diet and exercise was discussed.  Benefits of weight loss with rheumatoid arthritis was discussed.  Smoker - 1/3 pack/day for 10 years.  Strong association of smoking with rheumatoid arthritis was discussed.  Smoking cessation was advised and a handout was placed in the AVS.  Orders: Orders Placed This Encounter  Procedures   COMPLETE METABOLIC PANEL WITH GFR   CBC with Differential/Platelet   Meds ordered this encounter  Medications   hydroxychloroquine  (PLAQUENIL ) 200 MG tablet    Sig: Take 1 tablet (200 mg total) by mouth 2 (two) times daily.    Dispense:  180 tablet    Refill:  0     Follow-Up Instructions: Return in about 2 months (around 05/17/2023) for Rheumatoid arthritis.   Maya Nash, MD  Note - This record has been created using Animal nutritionist.  Chart creation errors have been sought, but may not always  have been located. Such creation errors do not reflect on  the standard of medical care.

## 2023-03-19 ENCOUNTER — Encounter: Payer: Self-pay | Admitting: Rheumatology

## 2023-03-19 ENCOUNTER — Ambulatory Visit: Payer: Medicaid Other | Attending: Rheumatology | Admitting: Rheumatology

## 2023-03-19 VITALS — BP 151/91 | HR 88 | Resp 14 | Ht 60.0 in | Wt 259.0 lb

## 2023-03-19 DIAGNOSIS — G8929 Other chronic pain: Secondary | ICD-10-CM

## 2023-03-19 DIAGNOSIS — Z6841 Body Mass Index (BMI) 40.0 and over, adult: Secondary | ICD-10-CM

## 2023-03-19 DIAGNOSIS — M5417 Radiculopathy, lumbosacral region: Secondary | ICD-10-CM

## 2023-03-19 DIAGNOSIS — I10 Essential (primary) hypertension: Secondary | ICD-10-CM

## 2023-03-19 DIAGNOSIS — E785 Hyperlipidemia, unspecified: Secondary | ICD-10-CM

## 2023-03-19 DIAGNOSIS — M25512 Pain in left shoulder: Secondary | ICD-10-CM

## 2023-03-19 DIAGNOSIS — Z79899 Other long term (current) drug therapy: Secondary | ICD-10-CM | POA: Diagnosis not present

## 2023-03-19 DIAGNOSIS — F17200 Nicotine dependence, unspecified, uncomplicated: Secondary | ICD-10-CM

## 2023-03-19 DIAGNOSIS — M25561 Pain in right knee: Secondary | ICD-10-CM

## 2023-03-19 DIAGNOSIS — M79641 Pain in right hand: Secondary | ICD-10-CM | POA: Diagnosis not present

## 2023-03-19 DIAGNOSIS — M79672 Pain in left foot: Secondary | ICD-10-CM

## 2023-03-19 DIAGNOSIS — M0579 Rheumatoid arthritis with rheumatoid factor of multiple sites without organ or systems involvement: Secondary | ICD-10-CM | POA: Diagnosis not present

## 2023-03-19 DIAGNOSIS — M25562 Pain in left knee: Secondary | ICD-10-CM

## 2023-03-19 DIAGNOSIS — G5712 Meralgia paresthetica, left lower limb: Secondary | ICD-10-CM

## 2023-03-19 DIAGNOSIS — M25571 Pain in right ankle and joints of right foot: Secondary | ICD-10-CM

## 2023-03-19 DIAGNOSIS — M25572 Pain in left ankle and joints of left foot: Secondary | ICD-10-CM

## 2023-03-19 DIAGNOSIS — M79671 Pain in right foot: Secondary | ICD-10-CM

## 2023-03-19 DIAGNOSIS — M25511 Pain in right shoulder: Secondary | ICD-10-CM

## 2023-03-19 DIAGNOSIS — E66813 Obesity, class 3: Secondary | ICD-10-CM

## 2023-03-19 DIAGNOSIS — M79642 Pain in left hand: Secondary | ICD-10-CM

## 2023-03-19 DIAGNOSIS — G4733 Obstructive sleep apnea (adult) (pediatric): Secondary | ICD-10-CM

## 2023-03-19 MED ORDER — HYDROXYCHLOROQUINE SULFATE 200 MG PO TABS: 200.0000 mg | ORAL_TABLET | Freq: Two times a day (BID) | ORAL | 0 refills | Status: DC

## 2023-03-19 NOTE — Progress Notes (Signed)
 Pharmacy Note  Subjective: Patient presents today to [facility] for follow up office visit.   Patient seen by the pharmacist for counseling on hydroxychloroquine  for rheumatoid arthritis. She is naive to treatment  Objective: CMP     Component Value Date/Time   NA 140 03/06/2023 0949   NA 140 11/28/2022 1104   K 4.3 03/06/2023 0949   CL 101 03/06/2023 0949   CO2 30 03/06/2023 0949   GLUCOSE 85 03/06/2023 0949   BUN 13 03/06/2023 0949   BUN 18 11/28/2022 1104   CREATININE 0.97 03/06/2023 0949   CALCIUM  9.4 03/06/2023 0949   PROT 7.5 03/06/2023 0949   PROT 7.3 11/28/2022 1104   ALBUMIN 4.5 11/28/2022 1104   AST 13 03/06/2023 0949   ALT 17 03/06/2023 0949   ALKPHOS 71 11/28/2022 1104   BILITOT 0.6 03/06/2023 0949   BILITOT 0.6 11/28/2022 1104    CBC    Component Value Date/Time   WBC 6.5 03/06/2023 0949   RBC 3.97 03/06/2023 0949   HGB 12.7 03/06/2023 0949   HGB 11.8 11/28/2022 1104   HCT 39.0 03/06/2023 0949   HCT 36.9 11/28/2022 1104   PLT 295 03/06/2023 0949   PLT 296 11/28/2022 1104   MCV 98.2 03/06/2023 0949   MCV 101 (H) 11/28/2022 1104   MCH 32.0 03/06/2023 0949   MCHC 32.6 03/06/2023 0949   RDW 11.4 03/06/2023 0949   RDW 12.4 11/28/2022 1104   LYMPHSABS 2.1 04/03/2021 1102   EOSABS 169 03/06/2023 0949   EOSABS 0.1 04/03/2021 1102   BASOSABS 39 03/06/2023 0949   BASOSABS 0.0 04/03/2021 1102    Assessment/Plan: Patient was counseled on the purpose, proper use, and adverse effects of hydroxychloroquine  including nausea/diarrhea, skin rash, headaches, and sun sensitivity.  Advised patient to wear sunscreen once starting hydroxychloroquine  to reduce risk of rash associated with sun sensitivity.  Discussed importance of annual eye exams while on hydroxychloroquine  to monitor to ocular toxicity and discussed importance of frequent laboratory monitoring.  Provided patient with eye exam form for baseline ophthalmologic exam.  Provided patient with  educational materials on hydroxychloroquine  and answered all questions.  Patient consented to hydroxychloroquine . Will upload consent in the media tab.    Reviewed risk for QTC prolongation when used in combination with other QTc prolonging agents (including but not limited to antiarrhythmics, macrolide antibiotics, flouroquinolone antibiotics, haloperidol, quetiapine, olanzapine, risperidone, droperidol, ziprasidone, amitriptyline, citalopram, ondansetron, migraine triptans, and methadone).   Dose will be Plaquenil  200 mg twice daily.  Rx sent to pharmacy today.  Sherry Pennant, PharmD, MPH, BCPS, CPP Clinical Pharmacist (Rheumatology and Pulmonology)

## 2023-03-19 NOTE — Patient Instructions (Addendum)
 Standing Labs We placed an order today for your standing lab work.   Please have your standing labs drawn in 1 month, 3 months, then every 5 months  Please have your labs drawn 2 weeks prior to your appointment so that the provider can discuss your lab results at your appointment, if possible.  Please note that you may see your imaging and lab results in MyChart before we have reviewed them. We will contact you once all results are reviewed. Please allow our office up to 72 hours to thoroughly review all of the results before contacting the office for clarification of your results.  WALK-IN LAB HOURS  Monday through Thursday from 8:00 am -12:30 pm and 1:00 pm-5:00 pm and Friday from 8:00 am-12:00 pm.  Patients with office visits requiring labs will be seen before walk-in labs.  You may encounter longer than normal wait times. Please allow additional time. Wait times may be shorter on  Monday and Thursday afternoons.  We do not book appointments for walk-in labs. We appreciate your patience and understanding with our staff.   Labs are drawn by [facility]. Please bring your co-pay at the time of your lab draw.  You may receive a bill from [facility] for your lab work.  Please note if you are on Hydroxychloroquine  and and an order has been placed for a Hydroxychloroquine  level,  you will need to have it drawn 4 hours or more after your last dose.  If you wish to have your labs drawn at another location, please call the office 24 hours in advance so we can fax the orders.  The office is located at [address], [place], [place] [address]   If you have any questions regarding directions or hours of operation,  please call [phone].   As a reminder, please drink plenty of water prior to coming for your lab work. Thanks!   Rheumatoid Arthritis Rheumatoid arthritis (RA) is a long-term (chronic) disease. RA causes inflammation in your joints. Your joints may feel painful, stiff,  swollen, and warm. RA may start slowly. It most often affects the small joints of the hands and feet. It can also affect other parts of the body. Symptoms of RA often come and go. There is no cure for RA, but medicines can help your symptoms. What are the causes? RA is an autoimmune disease. This means that your body's defense system (immune system) attacks healthy parts of your body by mistake. The exact cause of RA is not known. What increases the risk? Being female. Having a family history of RA or other diseases like RA. Smoking. Being very overweight (obese). Being exposed to pollutants or chemicals. What are the signs or symptoms? Symptoms start slowly. They are often worse in the morning. The first symptom is often morning stiffness that lasts longer than 30 minutes. As RA gets worse, symptoms may include: Pain, stiffness, swelling, warmth, and tenderness in joints on both sides of your body. Loss of energy. Not wanting to eat as much as normal. Weight loss. A low fever. Dry eyes and a dry mouth. Firm lumps that grow under your skin. Changes in the way your joints look or the way they work. Symptoms vary and they often come and go. Symptoms sometimes get worse for a period of time. These are called flares. How is this treated? Treatment may include: Taking good care of yourself. Be sure to rest as needed, eat a healthy diet, and exercise. Medicines. These may include: Pain  relievers. Medicines to help with inflammation. Disease-modifying antirheumatic drugs (DMARDs). Medicines called biologic response modifiers. Physical therapy and occupational therapy. Surgery, if joint damage is very bad. Your doctor will work with you to find the best treatments. Follow these instructions at home: Managing pain, stiffness, and swelling If told, put heat on the affected area. Do this as often as told by your doctor. Use the heat source that your doctor recommends, such as a moist heat  pack or a heating pad. Place a towel between your skin and the heat source. Leave the heat on for 20-30 minutes. Take off the heat if your skin turns bright red. This is very important. If you cannot feel pain, heat, or cold, you have a greater risk of getting burned.  Activity Return to your normal activities when your doctor says that it is safe. Rest when you have a flare. Exercise as told by your doctor. This can help your joints move better and get stronger. General instructions Take over-the-counter and prescription medicines only as told by your doctor. Keep all follow-up visits. Where to find more information [employer]: rheumatology.org Arthritis Foundation: arthritis.org Contact a doctor if: You have a flare. You have a fever. You have problems because of your medicines. Get help right away if: You have chest pain. You have trouble breathing. You get a hot, painful joint all of a sudden, and it is worse than your normal joint aches. These symptoms may be an emergency. Get help right away. Call 911. Do not wait to see if the symptoms will go away. Do not drive yourself to the hospital. Summary RA is a long-term disease. RA causes inflammation in your joints. Symptoms of RA start slowly. They are often worse in the morning.  Hydroxychloroquine  Tablets What is this medication? HYDROXYCHLOROQUINE  (hye drox ee KLOR oh kwin) treats autoimmune conditions, such as rheumatoid arthritis and lupus. It works by slowing down an overactive immune system. It may also be used to prevent and treat malaria. It works by killing the parasite that causes malaria. It belongs to a group of medications called DMARDs. This medicine may be used for other purposes; ask your health care provider or pharmacist if you have questions. COMMON BRAND NAME(S): Plaquenil , Quineprox, SOVUNA  What should I tell my care team before I take this medication? They need to know if you have  any of these conditions: Diabetes Eye disease, vision problems Frequently drink alcohol G6PD deficiency Heart disease Irregular heartbeat or rhythm Kidney disease Liver disease Porphyria Psoriasis An unusual or allergic reaction to hydroxychloroquine , other medications, foods, dyes, or preservatives Pregnant or trying to get pregnant Breastfeeding How should I use this medication? Take this medication by mouth with water. Take it as directed on the prescription label. Do not cut, crush, or chew this medication. Swallow the tablets whole. Take it with food. Do not take it more than directed. Take all of this medication unless your care team tells you to stop it early. Keep taking it even if you think you are better. Take products with antacids in them at a different time of day than this medication. Take this medication 4 hours before or 4 hours after antacids. Talk to your care team if you have questions. Talk to your care team about the use of this medication in children. While this medication may be prescribed for selected conditions, precautions do apply. Overdosage: If you think you have taken too much of this medicine contact a poison control center  or emergency room at once. NOTE: This medicine is only for you. Do not share this medicine with others. What if I miss a dose? If you miss a dose, take it as soon as you can. If it is almost time for your next dose, take only that dose. Do not take double or extra doses. What may interact with this medication? Do not take this medication with any of the following: Cisapride Dronedarone Pimozide Thioridazine This medication may also interact with the following: Ampicillin Antacids Cimetidine Cyclosporine Digoxin Kaolin Medications for diabetes, such as insulin, glipizide, glyburide Medications for seizures, such as carbamazepine, phenobarbital, phenytoin Mefloquine Methotrexate Other medications that cause heart rhythm  changes Praziquantel This list may not describe all possible interactions. Give your health care provider a list of all the medicines, herbs, non-prescription drugs, or dietary supplements you use. Also tell them if you smoke, drink alcohol, or use illegal drugs. Some items may interact with your medicine. What should I watch for while using this medication? Visit your care team for regular checks on your progress. Tell your care team if your symptoms do not start to get better or if they get worse. You may need blood work done while you are taking this medication. If you take other medications that can affect heart rhythm, you may need more testing. Talk to your care team if you have questions. Your vision may be tested before and during use of this medication. Tell your care team right away if you have any change in your eyesight. This medication may cause serious skin reactions. They can happen weeks to months after starting the medication. Contact your care team right away if you notice fevers or flu-like symptoms with a rash. The rash may be red or purple and then turn into blisters or peeling of the skin. Or, you might notice a red rash with swelling of the face, lips or lymph nodes in your neck or under your arms. If you or your family notice any changes in your behavior, such as new or worsening depression, thoughts of harming yourself, anxiety, or other unusual or disturbing thoughts, or memory loss, call your care team right away. What side effects may I notice from receiving this medication? Side effects that you should report to your care team as soon as possible: Allergic reactions--skin rash, itching, hives, swelling of the face, lips, tongue, or throat Aplastic anemia--unusual weakness or fatigue, dizziness, headache, trouble breathing, increased bleeding or bruising Change in vision Heart rhythm changes--fast or irregular heartbeat, dizziness, feeling faint or lightheaded, chest pain,  trouble breathing Infection--fever, chills, cough, or sore throat Low blood sugar (hypoglycemia)--tremors or shaking, anxiety, sweating, cold or clammy skin, confusion, dizziness, rapid heartbeat Muscle injury--unusual weakness or fatigue, muscle pain, dark yellow or brown urine, decrease in amount of urine Pain, tingling, or numbness in the hands or feet Rash, fever, and swollen lymph nodes Redness, blistering, peeling, or loosening of the skin, including inside the mouth Thoughts of suicide or self-harm, worsening mood, or feelings of depression Unusual bruising or bleeding Side effects that usually do not require medical attention (report to your care team if they continue or are bothersome): Diarrhea Headache Nausea Stomach pain Vomiting This list may not describe all possible side effects. Call your doctor for medical advice about side effects. You may report side effects to FDA at 1-800-FDA-1088. Where should I keep my medication? Keep out of the reach of children and pets. Store at room temperature up to 30 degrees C (  86 degrees F). Protect from light. Get rid of any unused medication after the expiration date. To get rid of medications that are no longer needed or have expired: Take the medication to a medication take-back program. Check with your pharmacy or law enforcement to find a location. If you cannot return the medication, check the label or package insert to see if the medication should be thrown out in the garbage or flushed down the toilet. If you are not sure, ask your care team. If it is safe to put it in the trash, empty the medication out of the container. Mix the medication with cat litter, dirt, coffee grounds, or other unwanted substance. Seal the mixture in a bag or container. Put it in the trash. NOTE: This sheet is a summary. It may not cover all possible information. If you have questions about this medicine, talk to your doctor, pharmacist, or health care  provider.  2024 Elsevier/Gold Standard (2021-09-09 00:00:00)  This information is not intended to replace advice given to you by your health care provider. Make sure you discuss any questions you have with your health care provider. Document Revised: 01/03/2021 Document Reviewed: 01/03/2021 Elsevier Patient Education  2024 [employer] Inc[employer]  Standing Labs We placed an order today for your standing lab work.   Please have your standing labs drawn in 1 month after starting medication then 3 months and then every 5 months  Please have your labs drawn 2 weeks prior to your appointment so that the provider can discuss your lab results at your appointment, if possible.  Please note that you may see your imaging and lab results in MyChart before we have reviewed them. We will contact you once all results are reviewed. Please allow our office up to 72 hours to thoroughly review all of the results before contacting the office for clarification of your results.  WALK-IN LAB HOURS  Monday through Thursday from 8:00 am -12:30 pm and 1:00 pm-5:00 pm and Friday from 8:00 am-12:00 pm.  Patients with office visits requiring labs will be seen before walk-in labs.  You may encounter longer than normal wait times. Please allow additional time. Wait times may be shorter on  Monday and Thursday afternoons.  We do not book appointments for walk-in labs. We appreciate your patience and understanding with our staff.   Labs are drawn by [facility]. Please bring your co-pay at the time of your lab draw.  You may receive a bill from [facility] for your lab work.  Please note if you are on Hydroxychloroquine  and and an order has been placed for a Hydroxychloroquine  level,  you will need to have it drawn 4 hours or more after your last dose.  If you wish to have your labs drawn at another location, please call the office 24 hours in advance so we can fax the orders.  The office is located at [address], [address],  [place], [place] [address]   If you have any questions regarding directions or hours of operation,  please call [phone].   As a reminder, please drink plenty of water prior to coming for your lab work. Thanks!   Vaccines You are taking a medication(s) that can suppress your immune system.  The following immunizations are recommended: Flu annually Covid-19  RSV Td/Tdap (tetanus, diphtheria, pertussis) every 10 years Pneumonia (Prevnar 15 then Pneumovax 23 at least 1 year apart.  Alternatively, can take Prevnar 20 without needing additional dose) Shingrix: 2 doses from 4 weeks to  6 months apart  Please check with your PCP to make sure you are up to date.    Managing the Challenge of Quitting Smoking Quitting smoking is a physical and mental challenge. You may have cravings, withdrawal symptoms, and temptation to smoke. Before quitting, work with your health care provider to make a plan that can help you manage quitting. Making a plan before you quit may keep you from smoking when you have the urge to smoke while trying to quit. How to manage lifestyle changes Managing stress Stress can make you want to smoke, and wanting to smoke may cause stress. It is important to find ways to manage your stress. You could try some of the following: Practice relaxation techniques. Breathe slowly and deeply, in through your nose and out through your mouth. Listen to music. Soak in a bath or take a shower. Imagine a peaceful place or vacation. Get some support. Talk with family or friends about your stress. Join a support group. Talk with a counselor or therapist. Get some physical activity. Go for a walk, run, or bike ride. Play a favorite sport. Practice yoga.  Medicines Talk with your health care provider about medicines that might help you deal with cravings and make quitting easier for you. Relationships Social situations can be difficult when you are quitting smoking. To manage this, you  can: Avoid parties and other social situations where people might be smoking. Avoid alcohol. Leave right away if you have the urge to smoke. Explain to your family and friends that you are quitting smoking. Ask for support and let them know you might be a bit grumpy. Plan activities where smoking is not an option. General instructions Be aware that many people gain weight after they quit smoking. However, not everyone does. To keep from gaining weight, have a plan in place before you quit, and stick to the plan after you quit. Your plan should include: Eating healthy snacks. When you have a craving, it may help to: Eat popcorn, or try carrots, celery, or other cut vegetables. Chew sugar-free gum. Changing how you eat. Eat small portion sizes at meals. Eat 4-6 small meals throughout the day instead of 1-2 large meals a day. Be mindful when you eat. You should avoid watching television or doing other things that might distract you as you eat. Exercising regularly. Make time to exercise each day. If you do not have time for a long workout, do short bouts of exercise for 5-10 minutes several times a day. Do some form of strengthening exercise, such as weight lifting. Do some exercise that gets your heart beating and causes you to breathe deeply, such as walking fast, running, swimming, or biking. This is very important. Drinking plenty of water or other low-calorie or no-calorie drinks. Drink enough fluid to keep your urine pale yellow.  How to recognize withdrawal symptoms Your body and mind may experience discomfort as you try to get used to not having nicotine in your system. These effects are called withdrawal symptoms. They may include: Feeling hungrier than normal. Having trouble concentrating. Feeling irritable or restless. Having trouble sleeping. Feeling depressed. Craving a cigarette. These symptoms may surprise you, but they are normal to have when quitting smoking. To manage  withdrawal symptoms: Avoid places, people, and activities that trigger your cravings. Remember why you want to quit. Get plenty of sleep. Avoid coffee and other drinks that contain caffeine. These may worsen some of your symptoms. How to manage cravings Come up with  a plan for how to deal with your cravings. The plan should include the following: A definition of the specific situation you want to deal with. An activity or action you will take to replace smoking. A clear idea for how this action will help. The name of someone who could help you with this. Cravings usually last for 5-10 minutes. Consider taking the following actions to help you with your plan to deal with cravings: Keep your mouth busy. Chew sugar-free gum. Suck on hard candies or a straw. Brush your teeth. Keep your hands and body busy. Change to a different activity right away. Squeeze or play with a ball. Do an activity or a hobby, such as making bead jewelry, practicing needlepoint, or working with wood. Mix up your normal routine. Take a short exercise break. Go for a quick walk, or run up and down stairs. Focus on doing something kind or helpful for someone else. Call a friend or family member to talk during a craving. Join a support group. Contact a quitline. Where to find support To get help or find a support group: Call the National Cancer Institute's Smoking Quitline: 1-800-QUIT-NOW 581-669-9603) Text QUIT to SmokefreeTXT: 521151 Where to find more information Visit these websites to find more information on quitting smoking: U.S. Department of Health and Human Services: www.smokefree.gov American Lung Association: www.freedomfromsmoking.org Centers for Disease Control and Prevention (CDC): footballexhibition.com.br American Heart Association: www.heart.org Contact a health care provider if: You want to change your plan for quitting. The medicines you are taking are not helping. Your eating feels out of control or you  cannot sleep. You feel depressed or become very anxious. Summary Quitting smoking is a physical and mental challenge. You will face cravings, withdrawal symptoms, and temptation to smoke again. Preparation can help you as you go through these challenges. Try different techniques to manage stress, handle social situations, and prevent weight gain. You can deal with cravings by keeping your mouth busy (such as by chewing gum), keeping your hands and body busy, calling family or friends, or contacting a quitline for people who want to quit smoking. You can deal with withdrawal symptoms by avoiding places where people smoke, getting plenty of rest, and avoiding drinks that contain caffeine. This information is not intended to replace advice given to you by your health care provider. Make sure you discuss any questions you have with your health care provider. Document Revised: 02/22/2021 Document Reviewed: 02/22/2021 Elsevier Patient Education  2024 [employer].

## 2023-03-23 ENCOUNTER — Telehealth: Payer: Self-pay | Admitting: Rheumatology

## 2023-03-23 DIAGNOSIS — M0579 Rheumatoid arthritis with rheumatoid factor of multiple sites without organ or systems involvement: Secondary | ICD-10-CM

## 2023-03-23 DIAGNOSIS — Z79899 Other long term (current) drug therapy: Secondary | ICD-10-CM

## 2023-03-23 MED ORDER — HYDROXYCHLOROQUINE SULFATE 200 MG PO TABS: 200.0000 mg | ORAL_TABLET | Freq: Two times a day (BID) | ORAL | 0 refills | Status: AC

## 2023-03-23 NOTE — Telephone Encounter (Signed)
 Returned call to patient. She states she went to pick up her prescription over the weekend and the pharmacy advised her that her PLQ prescription would be over $200 to pick up. Patient states she is unable to afford that. Patient advised that she can get a 90 day supply with a goo Rx coupon at [facility] for around $45. Patient states that is more affordable. Via the good Rx website, faxed coupon to patient. Resent PLQ prescription to [facility].

## 2023-03-23 NOTE — Telephone Encounter (Signed)
 Pt called wanting to speak with the Dr. About her medication (plaquenil) Pt states it cost way to much for her and would like to know if there were any other options. Call back number is [phone]

## 2023-04-06 ENCOUNTER — Ambulatory Visit: Payer: Medicaid Other | Admitting: Rheumatology

## 2023-04-08 ENCOUNTER — Other Ambulatory Visit: Payer: Self-pay | Admitting: Nurse Practitioner

## 2023-04-08 MED ORDER — CETIRIZINE HCL 10 MG PO TABS: 10.0000 mg | ORAL_TABLET | Freq: Every day | ORAL | 0 refills | Status: DC

## 2023-04-08 MED ORDER — FERROUS FUMARATE 324 (106 FE) MG PO TABS: 1.0000 | ORAL_TABLET | ORAL | 1 refills | Status: AC

## 2023-04-09 ENCOUNTER — Other Ambulatory Visit: Payer: Self-pay

## 2023-04-09 DIAGNOSIS — I10 Essential (primary) hypertension: Secondary | ICD-10-CM

## 2023-04-09 MED ORDER — SPIRONOLACTONE-HCTZ 25-25 MG PO TABS: 1.0000 | ORAL_TABLET | Freq: Every day | ORAL | 5 refills | Status: DC

## 2023-04-09 NOTE — Telephone Encounter (Signed)
Please advise if you would like pt to continue. KH 

## 2023-05-07 NOTE — Progress Notes (Deleted)
 Office Visit Note  Patient: Miranda Charles             Date of Birth: [birth year]           MRN: [id number]             PCP: Ivonne Andrew, NP Referring: Ivonne Andrew, NP Visit Date: 05/21/2023 Occupation: @GUAROCC @  Subjective:  No chief complaint on file.   History of Present Illness: Miranda Charles is a 48 y.o. female ***     Activities of Daily Living:  Patient reports morning stiffness for *** {minute/hour:19697}.   Patient {ACTIONS;DENIES/REPORTS:21021675::"Denies"} nocturnal pain.  Difficulty dressing/grooming: {ACTIONS;DENIES/REPORTS:21021675::"Denies"} Difficulty climbing stairs: {ACTIONS;DENIES/REPORTS:21021675::"Denies"} Difficulty getting out of chair: {ACTIONS;DENIES/REPORTS:21021675::"Denies"} Difficulty using hands for taps, buttons, cutlery, and/or writing: {ACTIONS;DENIES/REPORTS:21021675::"Denies"}  No Rheumatology ROS completed.   PMFS History:  Patient Active Problem List   Diagnosis Date Noted   Right foot pain 03/31/2022   Primary hypertension 11/07/2021   Class 3 severe obesity due to excess calories with serious comorbidity and body mass index (BMI) of 50.0 to 59.9 in adult (HCC) 04/03/2020   Nicotine dependence, cigarettes, uncomplicated 04/03/2020   Obstructive sleep apnea 04/03/2020   Lumbosacral radiculopathy at S1 07/27/2018   Meralgia paresthetica of left side 07/27/2018    Past Medical History:  Diagnosis Date   Anemia    High cholesterol    HTN (hypertension)     Family History  Problem Relation Age of Onset   Hypertension Mother    Healthy Son    Healthy Daughter    Hypertension Daughter    Anemia Daughter    Healthy Daughter    No past surgical history on file. Social History   Social History Narrative   Not on file   Immunization History  Administered Date(s) Administered   Influenza,inj,Quad PF,6+ Mos 04/03/2020   PPD Test 11/11/2022   Unspecified SARS-COV-2 Vaccination 08/31/2019     Objective: Vital Signs:  There were no vitals taken for this visit.   Physical Exam   Musculoskeletal Exam: ***  CDAI Exam: CDAI Score: -- Patient Global: --; Provider Global: -- Swollen: --; Tender: -- Joint Exam 05/21/2023   No joint exam has been documented for this visit   There is currently no information documented on the homunculus. Go to the Rheumatology activity and complete the homunculus joint exam.  Investigation: No additional findings.  Imaging: No results found.  Recent Labs: Lab Results  Component Value Date   WBC 6.5 03/06/2023   HGB 12.7 03/06/2023   PLT 295 03/06/2023   NA 140 03/06/2023   K 4.3 03/06/2023   CL 101 03/06/2023   CO2 30 03/06/2023   GLUCOSE 85 03/06/2023   BUN 13 03/06/2023   CREATININE 0.97 03/06/2023   BILITOT 0.6 03/06/2023   ALKPHOS 71 11/28/2022   AST 13 03/06/2023   ALT 17 03/06/2023   PROT 7.5 03/06/2023   ALBUMIN 4.5 11/28/2022   CALCIUM 9.4 03/06/2023    Speciality Comments: No specialty comments available.  Procedures:  No procedures performed Allergies: Onion   Assessment / Plan:     Visit Diagnoses: No diagnosis found.  Orders: No orders of the defined types were placed in this encounter.  No orders of the defined types were placed in this encounter.   Face-to-face time spent with patient was *** minutes. Greater than 50% of time was spent in counseling and coordination of care.  Follow-Up Instructions: No follow-ups on file.   Ellen Henri, CMA  Note -  This record has been created using [employer].  Chart creation errors have been sought, but may not always  have been located. Such creation errors do not reflect on  the standard of medical care.

## 2023-05-11 ENCOUNTER — Ambulatory Visit
Admission: EM | Admit: 2023-05-11 | Discharge: 2023-05-11 | Disposition: A | Discharge status: Home / Self Care | Payer: Medicaid Other | Attending: Physician Assistant | Admitting: Physician Assistant

## 2023-05-11 DIAGNOSIS — M25512 Pain in left shoulder: Secondary | ICD-10-CM | POA: Diagnosis not present

## 2023-05-11 MED ORDER — PREDNISONE 20 MG PO TABS: 40.0000 mg | ORAL_TABLET | Freq: Every day | ORAL | 0 refills | Status: AC

## 2023-05-11 NOTE — ED Triage Notes (Signed)
 Patient presents to UC for left shoulder pain, states chronic shoulder pain since 3-4 years. States she lifted her arm to heat her food and developed severe pain since yesterday. States she is not taking any OTC pain meds since it does not relieve the pain. She did apply a pain cream that helped some.   Denies numbness or tingling.

## 2023-05-15 NOTE — ED Provider Notes (Signed)
 [facility]    CSN: [id number] Arrival date & time: 05/11/23  1041      History   Chief Complaint Chief Complaint  Patient presents with   Shoulder Pain    HPI Miranda Charles is a 48 y.o. female.   Patient here today for acute on chronic left shoulder pain. She reports that she has had intermittent pain in her left shoulder for the last 3-4 years but that yesterday she raised her left arm to heat up food and in doing so felt severe pain that has persisted despite OTC pain medication. She has used a topical cream with mild relief. She denies any numbness or tingling. She denies any other injury.   The history is provided by the patient.  Shoulder Pain Associated symptoms: no fever     Past Medical History:  Diagnosis Date   Anemia    High cholesterol    HTN (hypertension)     Patient Active Problem List   Diagnosis Date Noted   Right foot pain 03/31/2022   Primary hypertension 11/07/2021   Class 3 severe obesity due to excess calories with serious comorbidity and body mass index (BMI) of 50.0 to 59.9 in adult (HCC) 04/03/2020   Nicotine dependence, cigarettes, uncomplicated 04/03/2020   Obstructive sleep apnea 04/03/2020   Lumbosacral radiculopathy at S1 07/27/2018   Meralgia paresthetica of left side 07/27/2018    History reviewed. No pertinent surgical history.  OB History   No obstetric history on file.      Home Medications    Prior to Admission medications   Medication Sig Start Date End Date Taking? Authorizing Provider  predniSONE (DELTASONE) 20 MG tablet Take 2 tablets (40 mg total) by mouth daily with breakfast for 5 days. 05/11/23 05/16/23 Yes Tomi Bamberger, PA-C  atorvastatin (LIPITOR) 20 MG tablet TAKE 2 TABLETS(40 MG) BY MOUTH DAILY Patient not taking: Reported on 03/02/2023 03/02/23   Ivonne Andrew, NP  cetirizine (ZYRTEC) 10 MG tablet Take 1 tablet (10 mg total) by mouth daily. 04/08/23   Ivonne Andrew, NP  Cholecalciferol 125  MCG (5000 UT) TABS Take by mouth.    [provider]  Ferrous Fumarate (FERROCITE) 324 (106 Fe) MG TABS tablet Take 1 tablet (106 mg of iron total) by mouth as directed.  1 tablet Orally Three times a Week 04/08/23   Ivonne Andrew, NP  ferrous sulfate 325 (65 FE) MG tablet Take 1 tablet (325 mg total) by mouth daily with breakfast. 11/28/22   Ivonne Andrew, NP  hydroxychloroquine (PLAQUENIL) 200 MG tablet Take 1 tablet (200 mg total) by mouth 2 (two) times daily. 03/23/23   Pollyann Savoy, MD  metoprolol succinate (TOPROL-XL) 25 MG 24 hr tablet Take 25 mg by mouth daily. Patient not taking: Reported on 03/02/2023    [provider]  metoprolol tartrate (LOPRESSOR) 25 MG tablet TAKE 1 TABLET(25 MG) BY MOUTH DAILY 03/04/23   Ivonne Andrew, NP  spironolactone-hydrochlorothiazide (ALDACTAZIDE) 25-25 MG tablet Take 1 tablet by mouth daily. 04/09/23 10/06/23  Ivonne Andrew, NP    Family History Family History  Problem Relation Age of Onset   Hypertension Mother    Healthy Son    Healthy Daughter    Hypertension Daughter    Anemia Daughter    Healthy Daughter     Social History Social History   Tobacco Use   Smoking status: Some Days    Current packs/day: 0.50    Types: Cigarettes  Passive exposure: Never   Smokeless tobacco: Never   Tobacco comments:    1 pack every 3 to 4 days  Vaping Use   Vaping status: Never Used  Substance Use Topics   Alcohol use: Yes    Comment: social drinker   Drug use: Not Currently     Allergies   Onion   Review of Systems Review of Systems  Constitutional:  Negative for chills and fever.  Eyes:  Negative for discharge and redness.  Respiratory:  Negative for shortness of breath.   Gastrointestinal:  Negative for abdominal pain, nausea and vomiting.  Musculoskeletal:  Positive for arthralgias.  Neurological:  Negative for numbness.     Physical Exam Triage Vital Signs ED Triage Vitals  Encounter Vitals Group      BP 05/11/23 1348 124/82     Systolic BP Percentile --      Diastolic BP Percentile --      Pulse Rate 05/11/23 1348 71     Resp 05/11/23 1348 16     Temp 05/11/23 1348 98.3 F (36.8 C)     Temp Source 05/11/23 1348 Oral     SpO2 05/11/23 1348 96 %     Weight --      Height --      Head Circumference --      Peak Flow --      Pain Score 05/11/23 1346 10     Pain Loc --      Pain Education --      Exclude from Growth Chart --    No data found.  Updated Vital Signs BP 124/82 (BP Location: Right Arm)   Pulse 71   Temp 98.3 F (36.8 C) (Oral)   Resp 16   LMP 04/12/2023 (Approximate)   SpO2 96%   Visual Acuity Right Eye Distance:   Left Eye Distance:   Bilateral Distance:    Right Eye Near:   Left Eye Near:    Bilateral Near:     Physical Exam Vitals and nursing note reviewed.  Constitutional:      General: She is not in acute distress.    Appearance: Normal appearance. She is not ill-appearing.  HENT:     Head: Normocephalic and atraumatic.  Eyes:     Conjunctiva/sclera: Conjunctivae normal.  Cardiovascular:     Rate and Rhythm: Normal rate.  Pulmonary:     Effort: Pulmonary effort is normal. No respiratory distress.  Musculoskeletal:     Comments: Decreased ROM of left shoulder in all directions due to pain, normal ROM of left elbow and wrist. Mild TTP diffusely to left shoulder posteriorly and laterally.   Neurological:     Mental Status: She is alert.  Psychiatric:        Mood and Affect: Mood normal.        Behavior: Behavior normal.        Thought Content: Thought content normal.      UC Treatments / Results  Labs (all labs ordered are listed, but only abnormal results are displayed) Labs Reviewed - No data to display  EKG   Radiology No results found.  Procedures Procedures (including critical care time)  Medications Ordered in UC Medications - No data to display  Initial Impression / Assessment and Plan / UC Course  I have  reviewed the triage vital signs and the nursing notes.  Pertinent labs & imaging results that were available during my care of the patient were reviewed by me and  considered in my medical decision making (see chart for details).    Suspect soft tissue injury/ inflammation in setting of chronic pain/arthritis and will treat with steroid burst. Advised follow up with ortho if no gradual improvement or with any further concerns.   Final Clinical Impressions(s) / UC Diagnoses   Final diagnoses:  Acute pain of left shoulder   Discharge Instructions   None    ED Prescriptions     Medication Sig Dispense Auth. Provider   predniSONE (DELTASONE) 20 MG tablet Take 2 tablets (40 mg total) by mouth daily with breakfast for 5 days. 10 tablet Tomi Bamberger, PA-C      PDMP not reviewed this encounter.   Tomi Bamberger, PA-C 05/15/23 2154

## 2023-05-21 ENCOUNTER — Ambulatory Visit: Payer: Medicaid Other | Admitting: Rheumatology

## 2023-05-21 DIAGNOSIS — E66813 Obesity, class 3: Secondary | ICD-10-CM

## 2023-05-21 DIAGNOSIS — M5417 Radiculopathy, lumbosacral region: Secondary | ICD-10-CM

## 2023-05-21 DIAGNOSIS — F17200 Nicotine dependence, unspecified, uncomplicated: Secondary | ICD-10-CM

## 2023-05-21 DIAGNOSIS — G8929 Other chronic pain: Secondary | ICD-10-CM

## 2023-05-21 DIAGNOSIS — M0579 Rheumatoid arthritis with rheumatoid factor of multiple sites without organ or systems involvement: Secondary | ICD-10-CM

## 2023-05-21 DIAGNOSIS — Z79899 Other long term (current) drug therapy: Secondary | ICD-10-CM

## 2023-05-21 DIAGNOSIS — E785 Hyperlipidemia, unspecified: Secondary | ICD-10-CM

## 2023-05-21 DIAGNOSIS — G4733 Obstructive sleep apnea (adult) (pediatric): Secondary | ICD-10-CM

## 2023-05-21 DIAGNOSIS — M79671 Pain in right foot: Secondary | ICD-10-CM

## 2023-05-21 DIAGNOSIS — G5712 Meralgia paresthetica, left lower limb: Secondary | ICD-10-CM

## 2023-05-21 DIAGNOSIS — I10 Essential (primary) hypertension: Secondary | ICD-10-CM

## 2023-05-21 DIAGNOSIS — M79641 Pain in right hand: Secondary | ICD-10-CM

## 2023-05-29 ENCOUNTER — Ambulatory Visit: Payer: Self-pay | Admitting: Nurse Practitioner

## 2023-06-08 ENCOUNTER — Other Ambulatory Visit: Payer: Self-pay | Admitting: Nurse Practitioner

## 2023-06-08 DIAGNOSIS — I10 Essential (primary) hypertension: Secondary | ICD-10-CM

## 2023-06-12 ENCOUNTER — Other Ambulatory Visit: Payer: Self-pay

## 2023-06-12 MED ORDER — CETIRIZINE HCL 10 MG PO TABS: 10.0000 mg | ORAL_TABLET | Freq: Every day | ORAL | 0 refills | Status: DC

## 2023-07-16 ENCOUNTER — Ambulatory Visit: Payer: Self-pay | Admitting: Nurse Practitioner

## 2023-12-18 ENCOUNTER — Other Ambulatory Visit: Payer: Self-pay

## 2023-12-18 NOTE — Telephone Encounter (Signed)
 Called ptto find out if she was still taken Lipitor and metoprolol . No answer lvm . KH

## 2024-01-21 ENCOUNTER — Ambulatory Visit: Admitting: Nurse Practitioner

## 2024-02-19 ENCOUNTER — Encounter: Payer: Self-pay | Admitting: Nurse Practitioner

## 2024-02-19 ENCOUNTER — Ambulatory Visit (INDEPENDENT_AMBULATORY_CARE_PROVIDER_SITE_OTHER): Admitting: Nurse Practitioner

## 2024-02-19 VITALS — BP 106/104 | HR 108 | Wt 256.4 lb

## 2024-02-19 DIAGNOSIS — I10 Essential (primary) hypertension: Secondary | ICD-10-CM | POA: Diagnosis not present

## 2024-02-19 DIAGNOSIS — E7849 Other hyperlipidemia: Secondary | ICD-10-CM

## 2024-02-19 DIAGNOSIS — Z1329 Encounter for screening for other suspected endocrine disorder: Secondary | ICD-10-CM | POA: Diagnosis not present

## 2024-02-19 MED ORDER — CETIRIZINE HCL 10 MG PO TABS: 10.0000 mg | ORAL_TABLET | Freq: Every day | ORAL | 0 refills | Status: AC

## 2024-02-19 MED ORDER — ATORVASTATIN CALCIUM 20 MG PO TABS: 20.0000 mg | ORAL_TABLET | Freq: Every day | ORAL | 2 refills | Status: AC

## 2024-02-19 MED ORDER — FERROUS SULFATE 325 (65 FE) MG PO TABS: 325.0000 mg | ORAL_TABLET | Freq: Every day | ORAL | 2 refills | Status: AC

## 2024-02-19 MED ORDER — SPIRONOLACTONE-HCTZ 25-25 MG PO TABS: 1.0000 | ORAL_TABLET | Freq: Every day | ORAL | 5 refills | Status: AC

## 2024-02-19 MED ORDER — METOPROLOL TARTRATE 25 MG PO TABS: 25.0000 mg | ORAL_TABLET | Freq: Two times a day (BID) | ORAL | 0 refills | Status: DC

## 2024-02-19 NOTE — Progress Notes (Signed)
 Subjective   Patient ID: Miranda Charles, female    DOB: [birth year], 48 y.o.   MRN: [id number]  Chief Complaint  Patient presents with   Hyperlipidemia   Medication Refill    All medications   Hypertension    Referring provider: Oley Bascom RAMAN, NP  Shara Hartis is a 48 y.o. female with Past Medical History: No date: Anemia No date: High cholesterol No date: HTN (hypertension)   HPI  Patient presents today for follow-up on hypertension.  She does need labs today.  She does need medication refills today.  Overall she has been doing well and does not have any new issues or concerns today. Denies f/c/s, n/v/d, hemoptysis, PND, leg swelling Denies chest pain or edema     Allergies  Allergen Reactions   Onion Anaphylaxis and Swelling    throat closes     Immunization History  Administered Date(s) Administered   Influenza,inj,Quad PF,6+ Mos 04/03/2020   PPD Test 11/11/2022   Unspecified SARS-COV-2 Vaccination 08/31/2019    Tobacco History: Social History   Tobacco Use  Smoking Status Some Days   Current packs/day: 0.50   Types: Cigarettes   Passive exposure: Never  Smokeless Tobacco Never  Tobacco Comments   1 pack every 3 to 4 days   Ready to quit: Not Answered Counseling given: Not Answered Tobacco comments: 1 pack every 3 to 4 days   Outpatient Encounter Medications as of 02/19/2024  Medication Sig   Cholecalciferol 125 MCG (5000 UT) TABS Take by mouth.   [DISCONTINUED] atorvastatin  (LIPITOR) 20 MG tablet TAKE 2 TABLETS(40 MG) BY MOUTH DAILY   [DISCONTINUED] cetirizine  (ZYRTEC ) 10 MG tablet Take 1 tablet (10 mg total) by mouth daily.   [DISCONTINUED] ferrous sulfate  325 (65 FE) MG tablet Take 1 tablet (325 mg total) by mouth daily with breakfast.   [DISCONTINUED] metoprolol  tartrate (LOPRESSOR ) 25 MG tablet TAKE 1 TABLET(25 MG) BY MOUTH DAILY   [DISCONTINUED] spironolactone -hydrochlorothiazide (ALDACTAZIDE) 25-25 MG tablet Take 1 tablet by mouth daily.    atorvastatin  (LIPITOR) 20 MG tablet Take 1 tablet (20 mg total) by mouth daily.   cetirizine  (ZYRTEC ) 10 MG tablet Take 1 tablet (10 mg total) by mouth daily.   Ferrous Fumarate  (FERROCITE) 324 (106 Fe) MG TABS tablet Take 1 tablet (106 mg of iron total) by mouth as directed.  1 tablet Orally Three times a Week (Patient not taking: Reported on 02/19/2024)   ferrous sulfate  325 (65 FE) MG tablet Take 1 tablet (325 mg total) by mouth daily with breakfast.   hydroxychloroquine  (PLAQUENIL ) 200 MG tablet Take 1 tablet (200 mg total) by mouth 2 (two) times daily. (Patient not taking: Reported on 02/19/2024)   metoprolol  succinate (TOPROL -XL) 25 MG 24 hr tablet Take 25 mg by mouth daily. (Patient not taking: Reported on 02/19/2024)   metoprolol  tartrate (LOPRESSOR ) 25 MG tablet Take 1 tablet (25 mg total) by mouth 2 (two) times daily.   spironolactone -hydrochlorothiazide (ALDACTAZIDE) 25-25 MG tablet Take 1 tablet by mouth daily.   No facility-administered encounter medications on file as of 02/19/2024.    Review of Systems  Review of Systems  Constitutional: Negative.   HENT: Negative.    Cardiovascular: Negative.   Gastrointestinal: Negative.   Allergic/Immunologic: Negative.   Neurological: Negative.   Psychiatric/Behavioral: Negative.       Objective:   BP (!) 106/104 (BP Location: Right Arm, Patient Position: Sitting, Cuff Size: Large)   Pulse (!) 108   Wt 256 lb 6.4 oz (116.3 kg)  SpO2 100%   BMI 50.07 kg/m   Wt Readings from Last 5 Encounters:  02/19/24 256 lb 6.4 oz (116.3 kg)  03/19/23 259 lb (117.5 kg)  03/06/23 253 lb 12.8 oz (115.1 kg)  11/28/22 259 lb (117.5 kg)  03/28/22 268 lb (121.6 kg)     Physical Exam Vitals and nursing note reviewed.  Constitutional:      General: She is not in acute distress.    Appearance: She is well-developed.  Cardiovascular:     Rate and Rhythm: Normal rate and regular rhythm.  Pulmonary:     Effort: Pulmonary effort is normal.      Breath sounds: Normal breath sounds.  Neurological:     Mental Status: She is alert and oriented to person, place, and time.       Assessment & Plan:   Thyroid disorder screen -     TSH  Other hyperlipidemia -     Atorvastatin  Calcium ; Take 1 tablet (20 mg total) by mouth daily.  Dispense: 60 tablet; Refill: 2 -     Lipid panel  Primary hypertension -     Metoprolol  Tartrate; Take 1 tablet (25 mg total) by mouth 2 (two) times daily.  Dispense: 90 tablet; Refill: 0 -     Spironolactone -HCTZ; Take 1 tablet by mouth daily.  Dispense: 30 tablet; Refill: 5 -     CBC -     Comprehensive metabolic panel with GFR  Other orders -     Cetirizine  HCl; Take 1 tablet (10 mg total) by mouth daily.  Dispense: 90 tablet; Refill: 0 -     Ferrous Sulfate ; Take 1 tablet (325 mg total) by mouth daily with breakfast.  Dispense: 90 tablet; Refill: 2     Return in about 6 months (around 08/19/2024).     Bascom GORMAN Borer, NP 02/19/2024

## 2024-02-20 LAB — COMPREHENSIVE METABOLIC PANEL WITH GFR
ALT: 15 IU/L (ref 0–32)
AST: 16 IU/L (ref 0–40)
Albumin: 4.4 g/dL (ref 3.9–4.9)
Alkaline Phosphatase: 68 IU/L (ref 41–116)
BUN/Creatinine Ratio: 14 (ref 9–23)
BUN: 17 mg/dL (ref 6–24)
Bilirubin Total: 1 mg/dL (ref 0.0–1.2)
CO2: 23 mmol/L (ref 20–29)
Calcium: 9.8 mg/dL (ref 8.7–10.2)
Chloride: 102 mmol/L (ref 96–106)
Creatinine, Ser: 1.22 mg/dL — ABNORMAL HIGH (ref 0.57–1.00)
Globulin, Total: 2.8 g/dL (ref 1.5–4.5)
Glucose: 87 mg/dL (ref 70–99)
Potassium: 5 mmol/L (ref 3.5–5.2)
Sodium: 141 mmol/L (ref 134–144)
Total Protein: 7.2 g/dL (ref 6.0–8.5)
eGFR: 55 mL/min/1.73 — ABNORMAL LOW (ref >59)

## 2024-02-20 LAB — TSH: TSH: 1.69 u[IU]/mL (ref 0.450–4.500)

## 2024-02-20 LAB — CBC
Hematocrit: 40.2 % (ref 34.0–46.6)
Hemoglobin: 13.2 g/dL (ref 11.1–15.9)
MCH: 32.2 pg (ref 26.6–33.0)
MCHC: 32.8 g/dL (ref 31.5–35.7)
MCV: 98 fL — ABNORMAL HIGH (ref 79–97)
Platelets: 281 x10E3/uL (ref 150–450)
RBC: 4.1 x10E6/uL (ref 3.77–5.28)
RDW: 12.1 % (ref 11.7–15.4)
WBC: 7.7 x10E3/uL (ref 3.4–10.8)

## 2024-02-20 LAB — LIPID PANEL
Chol/HDL Ratio: 2.9 ratio (ref 0.0–4.4)
Cholesterol, Total: 173 mg/dL (ref 100–199)
HDL: 59 mg/dL (ref >39)
LDL Chol Calc (NIH): 99 mg/dL (ref 0–99)
Triglycerides: 82 mg/dL (ref 0–149)
VLDL Cholesterol Cal: 15 mg/dL (ref 5–40)

## 2024-02-22 ENCOUNTER — Ambulatory Visit: Payer: Self-pay | Admitting: Nurse Practitioner

## 2024-04-01 ENCOUNTER — Other Ambulatory Visit: Payer: Self-pay | Admitting: Nurse Practitioner

## 2024-04-01 DIAGNOSIS — I10 Essential (primary) hypertension: Secondary | ICD-10-CM

## 2024-04-01 NOTE — Telephone Encounter (Signed)
 metoprolol  tartrate (LOPRESSOR ) 25 MG tablet [Pharmacy Med Name: METOPROLOL  TARTRATE 25 MG TAB]

## 2024-04-02 ENCOUNTER — Ambulatory Visit
Admission: EM | Admit: 2024-04-02 | Discharge: 2024-04-02 | Disposition: A | Discharge status: Home / Self Care | Attending: Family Medicine | Admitting: Family Medicine

## 2024-04-02 DIAGNOSIS — M545 Low back pain, unspecified: Secondary | ICD-10-CM | POA: Diagnosis not present

## 2024-04-02 DIAGNOSIS — J309 Allergic rhinitis, unspecified: Secondary | ICD-10-CM

## 2024-04-02 DIAGNOSIS — J018 Other acute sinusitis: Secondary | ICD-10-CM

## 2024-04-02 DIAGNOSIS — Z8739 Personal history of other diseases of the musculoskeletal system and connective tissue: Secondary | ICD-10-CM

## 2024-04-02 DIAGNOSIS — F17200 Nicotine dependence, unspecified, uncomplicated: Secondary | ICD-10-CM

## 2024-04-02 MED ORDER — AMOXICILLIN 500 MG PO CAPS: 500.0000 mg | ORAL_CAPSULE | Freq: Three times a day (TID) | ORAL | 0 refills | Status: AC

## 2024-04-02 MED ORDER — CYCLOBENZAPRINE HCL 5 MG PO TABS: 5.0000 mg | ORAL_TABLET | Freq: Every evening | ORAL | 0 refills | Status: AC | PRN

## 2024-04-02 MED ORDER — PREDNISONE 20 MG PO TABS: ORAL_TABLET | ORAL | 0 refills | Status: AC

## 2024-04-02 NOTE — ED Provider Notes (Signed)
 " [facility]  Note:  This document was prepared using Conservation officer, historic buildings and may include unintentional dictation errors.  MRN: [id number] DOB: 30-May-1975  Subjective:   Miranda Charles is a 49 y.o. female presenting for 1.5 week history of sinus congestion, sinus drainage. No fever, cough, chest pain, wheezing. Has shortness of breath when she walks a good distance. No asthma. Smokes 1/2ppd.  Also reports several day history of right-sided low back pain, acute on chronic left-sided low back pain with radiation into her left leg.  Has a history of lumbosacral radiculopathy.  Has had this for years.  No new fall, trauma, weakness, changes to bowel or urinary habits, saddle paresthesia.  No dysuria, hematuria, urinary frequency.  No history of renal stones.  No history of diabetes.  Current Outpatient Medications  Medication Instructions   atorvastatin  (LIPITOR) 20 mg, Oral, Daily   cetirizine  (ZYRTEC ) 10 mg, Oral, Daily   Cholecalciferol 125 MCG (5000 UT) TABS Take by mouth.   Ferrous Fumarate  (FERROCITE) 324 (106 Fe) MG TABS tablet 106 mg of iron, Oral, As directed,  1 tablet Orally Three times a Week   ferrous sulfate  325 mg, Oral, Daily with breakfast   hydroxychloroquine  (PLAQUENIL ) 200 mg, Oral, 2 times daily   metoprolol  succinate (TOPROL -XL) 25 mg, Daily   metoprolol  tartrate (LOPRESSOR ) 25 mg, Oral, 2 times daily   spironolactone -hydrochlorothiazide (ALDACTAZIDE) 25-25 MG tablet 1 tablet, Oral, Daily    Allergies[1]  Past Medical History:  Diagnosis Date   Anemia    High cholesterol    HTN (hypertension)      History reviewed. No pertinent surgical history.  Family History  Problem Relation Age of Onset   Hypertension Mother    Healthy Son    Healthy Daughter    Hypertension Daughter    Anemia Daughter    Healthy Daughter     Social History   Occupational History   Not on file  Tobacco Use   Smoking status: Some Days     Current packs/day: 0.50    Types: Cigarettes    Passive exposure: Never   Smokeless tobacco: Never   Tobacco comments:    1 pack every 3 to 4 days  Vaping Use   Vaping status: Never Used  Substance and Sexual Activity   Alcohol use: Yes    Comment: occ   Drug use: Not Currently   Sexual activity: Yes    Birth control/protection: None     ROS   Objective:   Vitals: BP (!) 149/85 (BP Location: Right Arm)   Pulse 99   Temp 99 F (37.2 C) (Oral)   Resp 20   LMP 03/01/2024   SpO2 98%   Physical Exam Constitutional:      General: She is not in acute distress.    Appearance: Normal appearance. She is well-developed and normal weight. She is not ill-appearing, toxic-appearing or diaphoretic.  HENT:     Head: Normocephalic and atraumatic.     Right Ear: Tympanic membrane, ear canal and external ear normal. No drainage or tenderness. No middle ear effusion. There is no impacted cerumen. Tympanic membrane is not erythematous or bulging.     Left Ear: Tympanic membrane, ear canal and external ear normal. No drainage or tenderness.  No middle ear effusion. There is no impacted cerumen. Tympanic membrane is not erythematous or bulging.     Nose: Congestion present. No rhinorrhea.     Mouth/Throat:     Mouth:  Mucous membranes are moist. No oral lesions.     Pharynx: No pharyngeal swelling, oropharyngeal exudate, posterior oropharyngeal erythema or uvula swelling.     Tonsils: No tonsillar exudate or tonsillar abscesses.  Eyes:     General: No scleral icterus.       Right eye: No discharge.        Left eye: No discharge.     Extraocular Movements: Extraocular movements intact.     Right eye: Normal extraocular motion.     Left eye: Normal extraocular motion.     Conjunctiva/sclera: Conjunctivae normal.  Cardiovascular:     Rate and Rhythm: Normal rate and regular rhythm.     Heart sounds: Normal heart sounds. No murmur heard.    No friction rub. No gallop.  Pulmonary:      Effort: Pulmonary effort is normal. No respiratory distress.     Breath sounds: No stridor. No wheezing, rhonchi or rales.  Chest:     Chest wall: No tenderness.  Musculoskeletal:     Cervical back: Normal range of motion and neck supple.     Lumbar back: Spasms and tenderness present. No swelling, edema, deformity, signs of trauma, lacerations or bony tenderness. Normal range of motion. Negative right straight leg raise test and negative left straight leg raise test. No scoliosis.       Back:  Lymphadenopathy:     Cervical: No cervical adenopathy.  Skin:    General: Skin is warm and dry.  Neurological:     General: No focal deficit present.     Mental Status: She is alert and oriented to person, place, and time.  Psychiatric:        Mood and Affect: Mood normal.        Behavior: Behavior normal.     Assessment and Plan :   PDMP not reviewed this encounter.  1. Acute non-recurrent sinusitis of other sinus   2. Allergic rhinitis, unspecified seasonality, unspecified trigger   3. Smoker   4. Acute right-sided low back pain without sciatica   5. History of intervertebral disc disorder      Deferred imaging given clear pulmonary exam.  Will have patient undergo a trial of amoxicillin  to address sinusitis secondary to allergic rhinitis, viral URI and likely worsened by her smoking.  As such we will also use prednisone  to help with sinus and respiratory inflammation.  I expect this will also help with her acute on chronic low back pains.  Follow-up with her PCP and/or orthopedist.  Counseled patient on potential for adverse effects with medications prescribed/recommended today, ER and return-to-clinic precautions discussed, patient verbalized understanding.     [1]  Allergies Allergen Reactions   Onion Anaphylaxis and Swelling    throat closes      Christopher Savannah, [place] 04/02/24 1409  "

## 2024-04-02 NOTE — Discharge Instructions (Signed)
 We will manage this as a sinus infection with amoxicillin . For sore throat or cough try using a honey-based tea. Use 3 teaspoons of honey with juice squeezed from half lemon. Place shaved pieces of ginger into 1/2-1 cup of water and warm over stove top. Then mix the ingredients and repeat every 4 hours as needed. Please take Tylenol 500mg -650mg  every 6 hours for throat pain, fevers, aches and pains. Hydrate very well with at least 2 liters of water. Eat light meals such as soups (chicken and noodles, vegetable, chicken and wild rice).  Do not eat foods that you are allergic to.  Taking an antihistamine like Zyrtec  can help against postnasal drainage, sinus congestion which can cause sinus pain, sinus headaches, throat pain, painful swallowing, coughing.  You can take this together with prednisone  for the same kind of sinus inflammation, nasal drip, congestion likely worsened by smoking and having an infection. The prednisone  steroid can also help with your back symptoms.

## 2024-04-02 NOTE — ED Triage Notes (Signed)
 Pt c/o nasal congestion x 1.5 weeks-was taking zyrtec  and advil sinus-NAD-steady gait

## 2024-08-19 ENCOUNTER — Ambulatory Visit: Payer: Self-pay | Admitting: Nurse Practitioner
